# Patient Record
Sex: Female | Born: 1955 | Race: White | Hispanic: No | Marital: Married | Smoking: Current every day smoker
Health system: Southern US, Community
[De-identification: ages and names within clinical notes are randomized; demographics above are authoritative.]

## PROBLEM LIST (undated history)

## (undated) DIAGNOSIS — J40 Bronchitis, not specified as acute or chronic: Secondary | ICD-10-CM

## (undated) DIAGNOSIS — I10 Essential (primary) hypertension: Secondary | ICD-10-CM

## (undated) HISTORY — PX: BREAST ENHANCEMENT SURGERY: SHX7

## (undated) HISTORY — DX: Essential (primary) hypertension: I10

## (undated) HISTORY — DX: Bronchitis, not specified as acute or chronic: J40

---

## 2019-03-15 DIAGNOSIS — I10 Essential (primary) hypertension: Secondary | ICD-10-CM | POA: Insufficient documentation

## 2019-03-28 LAB — COLOGUARD: Cologuard: NEGATIVE

## 2019-04-13 LAB — HM MAMMOGRAPHY

## 2019-04-13 LAB — HM DEXA SCAN

## 2019-04-14 DIAGNOSIS — M8580 Other specified disorders of bone density and structure, unspecified site: Secondary | ICD-10-CM | POA: Insufficient documentation

## 2020-11-16 DIAGNOSIS — R053 Chronic cough: Secondary | ICD-10-CM | POA: Diagnosis not present

## 2020-11-16 DIAGNOSIS — J40 Bronchitis, not specified as acute or chronic: Secondary | ICD-10-CM | POA: Diagnosis not present

## 2020-12-04 ENCOUNTER — Other Ambulatory Visit: Payer: Self-pay

## 2020-12-04 ENCOUNTER — Ambulatory Visit (INDEPENDENT_AMBULATORY_CARE_PROVIDER_SITE_OTHER): Payer: Medicare HMO | Admitting: Family Medicine

## 2020-12-04 ENCOUNTER — Encounter: Payer: Self-pay | Admitting: Family Medicine

## 2020-12-04 VITALS — BP 138/80 | HR 94 | Temp 95.7°F | Ht 61.0 in | Wt 108.8 lb

## 2020-12-04 DIAGNOSIS — I10 Essential (primary) hypertension: Secondary | ICD-10-CM | POA: Diagnosis not present

## 2020-12-04 DIAGNOSIS — Z1231 Encounter for screening mammogram for malignant neoplasm of breast: Secondary | ICD-10-CM | POA: Diagnosis not present

## 2020-12-04 DIAGNOSIS — L0211 Cutaneous abscess of neck: Secondary | ICD-10-CM

## 2020-12-04 DIAGNOSIS — Z13820 Encounter for screening for osteoporosis: Secondary | ICD-10-CM | POA: Diagnosis not present

## 2020-12-04 DIAGNOSIS — Z780 Asymptomatic menopausal state: Secondary | ICD-10-CM

## 2020-12-04 DIAGNOSIS — B351 Tinea unguium: Secondary | ICD-10-CM | POA: Diagnosis not present

## 2020-12-04 DIAGNOSIS — J208 Acute bronchitis due to other specified organisms: Secondary | ICD-10-CM

## 2020-12-04 DIAGNOSIS — L03221 Cellulitis of neck: Secondary | ICD-10-CM | POA: Diagnosis not present

## 2020-12-04 DIAGNOSIS — Z6820 Body mass index (BMI) 20.0-20.9, adult: Secondary | ICD-10-CM | POA: Diagnosis not present

## 2020-12-04 MED ORDER — TERBINAFINE HCL 250 MG PO TABS: 250.0000 mg | ORAL_TABLET | Freq: Every day | ORAL | 0 refills | Status: DC

## 2020-12-04 MED ORDER — DOXYCYCLINE HYCLATE 100 MG PO TABS: 100.0000 mg | ORAL_TABLET | Freq: Two times a day (BID) | ORAL | 0 refills | Status: DC

## 2020-12-04 NOTE — Progress Notes (Signed)
New Patient Office Visit  Subjective:  Patient ID: Gabriella Gonzalez, female    DOB: 30-Nov-1955  Age: 65 y.o. MRN: [id number]  CC:  Chief Complaint  Patient presents with  . Establish Care    HPI Gabriella Gonzalez presents establish care c/o cough (was seen a [facility] on 5/13 and was rx'd prednisone, tessalon pearls, amoxicillin.)  Amoxicillin was given by dentist due to a infected root. Also has a knot on the back of her neck, which she noticed it last week. It does not itch or is painful. Husband stuck a needle in it yesterday and nothing came out.  Pt has a history of hypertension. Has taken amlodipine and lisinopril in the past, however, both medications caused pedal edema. Was on medicines last year. Lisinopril caused a cough. Also mentioned that at times she has urinary incontinence "Only if I don't make it to the bathroom in time".  Patient would like to have a medication for toenail fungus. Pt has quit smoking in the past when she was pregnant x 2.   Past Medical History:  Diagnosis Date  . Benign hypertension   . Bronchitis      Past Surgical History:  Procedure Laterality Date  . BREAST ENHANCEMENT SURGERY      Family History  Problem Relation Age of Onset  . COPD Mother   . Cancer Father        bladder    Social History   Socioeconomic History  . Marital status: Married    Spouse name: Tierrah Anastos  . Number of children: 2  . Years of education: Not on file  . Highest education level: Not on file  Occupational History  . Occupation: Retired  Tobacco Use  . Smoking status: Current Every Day Smoker    Packs/day: 0.25    Types: Cigarettes  . Smokeless tobacco: Never Used  Substance and Sexual Activity  . Alcohol use: Yes    Alcohol/week: 14.0 standard drinks    Types: 14 Standard drinks or equivalent per week    Comment: 1-2 per day  . Drug use: Never  . Sexual activity: Not on file  Other Topics Concern  . Not on file  Social History Narrative  . Not  on file   Social Determinants of Health   Financial Resource Strain: Not on file  Food Insecurity: Not on file  Transportation Needs: Not on file  Physical Activity: Not on file  Stress: Not on file  Social Connections: Not on file  Intimate Partner Violence: Not on file    ROS Review of Systems  Constitutional: Negative for chills, fatigue and fever.  HENT: Positive for hearing loss (Right ear. Feels like there is water in her ear.). Negative for congestion, ear pain, rhinorrhea and sore throat.   Respiratory: Positive for cough. Negative for shortness of breath.   Cardiovascular: Negative for chest pain.  Gastrointestinal: Negative for abdominal pain, constipation, diarrhea, nausea and vomiting.  Genitourinary: Negative for dysuria and urgency.       Has some urinary incontinence  Musculoskeletal: Negative for arthralgias, back pain and myalgias.  Skin:       Raised and hard bump on posterior neck-  Neurological: Negative for dizziness, weakness, light-headedness and headaches.  Psychiatric/Behavioral: Negative for dysphoric mood. The patient is not nervous/anxious.     Objective:   Today's Vitals: BP 138/80   Pulse 94   Temp (!) 95.7 F (35.4 C)   Ht 5\' 1"  (1.549 m)  Wt 108 lb 12.8 oz (49.4 kg)   SpO2 98%   BMI 20.56 kg/m   Physical Exam Vitals reviewed.  Constitutional:      Appearance: Normal appearance. She is normal weight.  HENT:     Right Ear: Tympanic membrane, ear canal and external ear normal.     Left Ear: Tympanic membrane, ear canal and external ear normal.     Nose: Nose normal.     Mouth/Throat:     Pharynx: Oropharynx is clear.  Neck:     Vascular: No carotid bruit.  Cardiovascular:     Rate and Rhythm: Normal rate and regular rhythm.     Pulses: Normal pulses.     Heart sounds: Normal heart sounds. No murmur heard.   Pulmonary:     Effort: Pulmonary effort is normal. No respiratory distress.     Breath sounds: Normal breath sounds.   Abdominal:     General: Abdomen is flat. Bowel sounds are normal.     Palpations: Abdomen is soft.     Tenderness: There is no abdominal tenderness.  Skin:    Findings: Lesion (posterior rt neck -1 to 1 1/2 cm nodule. Nontender. Increased erythema. ) present.     Comments: All toes BL onychomycosis.   Neurological:     Mental Status: She is alert and oriented to person, place, and time.  Psychiatric:        Mood and Affect: Mood normal.        Behavior: Behavior normal.     Assessment & Plan:  1. Primary hypertension No medications recommended at this time.  Recommend continue to work on eating healthy diet and exercise. - CBC with Differential/Platelet - TSH  2. Acute bronchitis due to other specified organisms Start on doxycycline. Stop amoxicillin.  Stop amoxicillin.  3. Cellulitis and abscess of neck - doxycycline (VIBRA-TABS) 100 MG tablet; Take 1 tablet (100 mg total) by mouth 2 (two) times daily.  Dispense: 14 tablet; Refill: 0  4. Onychomycosis - Comprehensive metabolic panel - terbinafine (LAMISIL) 250 MG tablet; Take 1 tablet (250 mg total) by mouth daily.  Dispense: 90 tablet; Refill: 0  5. Screening mammogram for breast cancer - MM DIGITAL SCREENING BILATERAL  6. Body mass index (BMI) 20.0-20.9, adult  7. Encounter for osteoporosis screening in asymptomatic postmenopausal patient - DG Bone Density     Outpatient Encounter Medications as of 12/04/2020  Medication Sig  . amoxicillin (AMOXIL) 500 MG capsule Take 500 mg by mouth every 6 (six) hours.  . benzonatate (TESSALON) 200 MG capsule Take 200 mg by mouth 3 (three) times daily as needed for cough.  . doxycycline (VIBRA-TABS) 100 MG tablet Take 1 tablet (100 mg total) by mouth 2 (two) times daily.  Marland Kitchen terbinafine (LAMISIL) 250 MG tablet Take 1 tablet (250 mg total) by mouth daily.  . [DISCONTINUED] benzonatate (TESSALON) 200 MG capsule Take 200 mg by mouth in the morning, at noon, and at bedtime.  .  [DISCONTINUED] amLODipine (NORVASC) 5 MG tablet Take 5 mg by mouth daily. (Patient not taking: Reported on 12/04/2020)  . [DISCONTINUED] lisinopril (ZESTRIL) 20 MG tablet Take 20 mg by mouth daily. (Patient not taking: Reported on 12/04/2020)   No facility-administered encounter medications on file as of 12/04/2020.    Follow-up: Return in about 6 weeks (around 01/15/2021) for fasting visit with Dr. Sedalia Muta or Carollee Herter.Blane Ohara, MD

## 2020-12-04 NOTE — Patient Instructions (Addendum)
Start on doxycycline antibiotic for nodule on posterior neck. If does not improve, pt needs to call and will refer to dermatology or a general surgeon for excision. Start on terbinafine for toe nail fungal infection.  Consider quitting smoking. Info below.  Schedule mammogram and bone density.  Set up patient with follow up with Creola Corn, LPN for annual wellness visit. Recommend pt come fasting so cholesterol can be checked.  Pulmonary testing is concerning for COPD (Chronic bronchitis.) Start on breztri 2 puffs twice a day. Call back if seems to be helping and will send a new prescription for this.    Chronic Obstructive Pulmonary Disease  Chronic obstructive pulmonary disease (COPD) is a long-term (chronic) lung problem. When you have COPD, it is hard for air to get in and out of your lungs. Usually the condition gets worse over time, and your lungs will never return to normal. There are things you can do to keep yourself as healthy as possible. What are the causes?  Smoking. This is the most common cause.  Certain genes passed from parent to child (inherited). What increases the risk?  Being exposed to secondhand smoke from cigarettes, pipes, or cigars.  Being exposed to chemicals and other irritants, such as fumes and dust in the work environment.  Having chronic lung conditions or infections. What are the signs or symptoms?  Shortness of breath, especially during physical activity.  A long-term cough with a large amount of thick mucus. Sometimes, the cough may not have any mucus (dry cough).  Wheezing.  Breathing quickly.  Skin that looks gray or blue, especially in the fingers, toes, or lips.  Feeling tired (fatigue).  Weight loss.  Chest tightness.  Having infections often.  Episodes when breathing symptoms become much worse (exacerbations). At the later stages of this disease, you may have swelling in the ankles, feet, or legs. How is this treated?  Taking  medicines.  Quitting smoking, if you smoke.  Rehabilitation. This includes steps to make your body work better. It may involve a team of specialists.  Doing exercises.  Making changes to your diet.  Using oxygen.  Lung surgery.  Lung transplant.  Comfort measures (palliative care). Follow these instructions at home: Medicines  Take over-the-counter and prescription medicines only as told by your doctor.  Talk to your doctor before taking any cough or allergy medicines. You may need to avoid medicines that cause your lungs to be dry. Lifestyle  If you smoke, stop smoking. Smoking makes the problem worse.  Do not smoke or use any products that contain nicotine or tobacco. If you need help quitting, ask your doctor.  Avoid being around things that make your breathing worse. This may include smoke, chemicals, and fumes.  Stay active, but remember to rest as well.  Learn and use tips on how to manage stress and control your breathing.  Make sure you get enough sleep. Most adults need at least 7 hours of sleep every night.  Eat healthy foods. Eat smaller meals more often. Rest before meals. Controlled breathing Learn and use tips on how to control your breathing as told by your doctor. Try:  Breathing in (inhaling) through your nose for 1 second. Then, pucker your lips and breath out (exhale) through your lips for 2 seconds.  Putting one hand on your belly (abdomen). Breathe in slowly through your nose for 1 second. Your hand on your belly should move out. Pucker your lips and breathe out slowly through your lips.  Your hand on your belly should move in as you breathe out.   Controlled coughing Learn and use controlled coughing to clear mucus from your lungs. Follow these steps: 1. Lean your head a little forward. 2. Breathe in deeply. 3. Try to hold your breath for 3 seconds. 4. Keep your mouth slightly open while coughing 2 times. 5. Spit any mucus out into a  tissue. 6. Rest and do the steps again 1 or 2 times as needed. General instructions  Make sure you get all the shots (vaccines) that your doctor recommends. Ask your doctor about a flu shot and a pneumonia shot.  Use oxygen therapy and pulmonary rehabilitation if told by your doctor. If you need home oxygen therapy, ask your doctor if you should buy a tool to measure your oxygen level (oximeter).  Make a COPD action plan with your doctor. This helps you to know what to do if you feel worse than usual.  Manage any other conditions you have as told by your doctor.  Avoid going outside when it is very hot, cold, or humid.  Avoid people who have a sickness you can catch (contagious).  Keep all follow-up visits. Contact a doctor if:  You cough up more mucus than usual.  There is a change in the color or thickness of the mucus.  It is harder to breathe than usual.  Your breathing is faster than usual.  You have trouble sleeping.  You need to use your medicines more often than usual.  You have trouble doing your normal activities such as getting dressed or walking around the house. Get help right away if:  You have shortness of breath while resting.  You have shortness of breath that stops you from: ? Being able to talk. ? Doing normal activities.  Your chest hurts for longer than 5 minutes.  Your skin color is more blue than usual.  Your pulse oximeter shows that you have low oxygen for longer than 5 minutes.  You have a fever.  You feel too tired to breathe normally. These symptoms may represent a serious problem that is an emergency. Do not wait to see if the symptoms will go away. Get medical help right away. Call your local emergency services (911 in the [place] Do not drive yourself to the hospital. Summary  Chronic obstructive pulmonary disease (COPD) is a long-term lung problem.  The way your lungs work will never return to normal. Usually the condition gets  worse over time. There are things you can do to keep yourself as healthy as possible.  Take over-the-counter and prescription medicines only as told by your doctor.  If you smoke, stop. Smoking makes the problem worse. This information is not intended to replace advice given to you by your health care provider. Make sure you discuss any questions you have with your health care provider. Document Revised: 05/01/2020 Document Reviewed: 05/01/2020 Elsevier Patient Education  2021 Elsevier Inc.   Tobacco Use Disorder Tobacco use disorder (TUD) occurs when a person craves, seeks, and uses tobacco, regardless of the consequences. This disorder can cause problems with mental and physical health. It can affect your ability to have healthy relationships, and it can keep you from meeting your responsibilities at work, home, or school. Tobacco may be:  Smoked as a cigarette or cigar.  Inhaled using e-cigarettes.  Smoked in a pipe or hookah.  Chewed as smokeless tobacco.  Inhaled into the nostrils as snuff. Tobacco products contain a dangerous chemical  called nicotine, which is very addictive. Nicotine triggers hormones that make the body feel stimulated and works on areas of the brain that make you feel good. These effects can make it hard for people to quit nicotine. Tobacco contains many other unsafe chemicals that can damage almost every organ in the body. Smoking tobacco also puts others in danger due to fire risk and possible health problems caused by breathing in secondhand smoke. What are the signs or symptoms? Symptoms of TUD may include:  Being unable to slow down or stop your tobacco use.  Spending an abnormal amount of time getting or using tobacco.  Craving tobacco. Cravings may last for up to 6 months after quitting.  Tobacco use that: ? Interferes with your work, school, or home life. ? Interferes with your personal and social relationships. ? Makes you give up activities  that you once enjoyed or found important.  Using tobacco even though you know that it is: ? Dangerous or bad for your health or someone else's health. ? Causing problems in your life.  Needing more and more of the substance to get the same effect (developing tolerance).  Experiencing unpleasant symptoms if you do not use the substance (withdrawal). Withdrawal symptoms may include: ? Depressed, anxious, or irritable mood. ? Difficulty concentrating. ? Increased appetite. ? Restlessness or trouble sleeping.  Using the substance to avoid withdrawal. How is this diagnosed? This condition may be diagnosed based on:  Your current and past tobacco use. Your health care provider may ask questions about how your tobacco use affects your life.  A physical exam. You may be diagnosed with TUD if you have at least two symptoms within a 59-month period. How is this treated? This condition is treated by stopping tobacco use. Many people are unable to quit on their own and need help. Treatment may include:  Nicotine replacement therapy (NRT). NRT provides nicotine without the other harmful chemicals in tobacco. NRT gradually lowers the dosage of nicotine in the body and reduces withdrawal symptoms. NRT is available as: ? Over-the-counter gums, lozenges, and skin patches. ? Prescription mouth inhalers and nasal sprays.  Medicine that acts on the brain to reduce cravings and withdrawal symptoms.  A type of talk therapy that examines your triggers for tobacco use, how to avoid them, and how to cope with cravings (behavioral therapy).  Hypnosis. This may help with withdrawal symptoms.  Joining a support group for others coping with TUD. The best treatment for TUD is usually a combination of medicine, talk therapy, and support groups. Recovery can be a long process. Many people start using tobacco again after stopping (relapse). If you relapse, it does not mean that treatment will not work. Follow  these instructions at home: Lifestyle  Do not use any products that contain nicotine or tobacco, such as cigarettes and e-cigarettes.  Avoid things that trigger tobacco use as much as you can. Triggers include people and situations that usually cause you to use tobacco.  Avoid drinks that contain caffeine, including coffee. These may worsen some withdrawal symptoms.  Find ways to manage stress. Wanting to smoke may cause stress, and stress can make you want to smoke. Relaxation techniques such as deep breathing, meditation, and yoga may help.  Attend support groups as needed. These groups are an important part of long-term recovery for many people. General instructions  Take over-the-counter and prescription medicines only as told by your health care provider.  Check with your health care provider before taking any  new prescription or over-the-counter medicines.  Decide on a friend, family member, or smoking quit-line (such as 1-800-QUIT-NOW in the [place] that you can call or text when you feel the urge to smoke or when you need help coping with cravings.  Keep all follow-up visits as told by your health care provider and therapist. This is important.   Contact a health care provider if:  You are not able to take your medicines as prescribed.  Your symptoms get worse, even with treatment. Summary  Tobacco use disorder (TUD) occurs when a person craves, seeks, and uses tobacco regardless of the consequences.  This condition may be diagnosed based on your current and past tobacco use and a physical exam.  Many people are unable to quit on their own and need help. Recovery can be a long process.  The most effective treatment for TUD is usually a combination of medicine, talk therapy, and support groups. This information is not intended to replace advice given to you by your health care provider. Make sure you discuss any questions you have with your health care provider. Document  Revised: 06/10/2017 Document Reviewed: 06/10/2017 Elsevier Patient Education  2021 [employer]

## 2020-12-05 LAB — CBC WITH DIFFERENTIAL/PLATELET
Basophils Absolute: 0 10*3/uL (ref 0.0–0.2)
Basos: 0 %
EOS (ABSOLUTE): 0.1 10*3/uL (ref 0.0–0.4)
Eos: 1 %
Hematocrit: 43.4 % (ref 34.0–46.6)
Hemoglobin: 14.2 g/dL (ref 11.1–15.9)
Immature Grans (Abs): 0 10*3/uL (ref 0.0–0.1)
Immature Granulocytes: 0 %
Lymphocytes Absolute: 2.3 10*3/uL (ref 0.7–3.1)
Lymphs: 26 %
MCH: 32.1 pg (ref 26.6–33.0)
MCHC: 32.7 g/dL (ref 31.5–35.7)
MCV: 98 fL — ABNORMAL HIGH (ref 79–97)
Monocytes Absolute: 0.6 10*3/uL (ref 0.1–0.9)
Monocytes: 6 %
Neutrophils Absolute: 5.7 10*3/uL (ref 1.4–7.0)
Neutrophils: 67 %
Platelets: 283 10*3/uL (ref 150–450)
RBC: 4.43 x10E6/uL (ref 3.77–5.28)
RDW: 12.5 % (ref 11.7–15.4)
WBC: 8.6 10*3/uL (ref 3.4–10.8)

## 2020-12-05 LAB — COMPREHENSIVE METABOLIC PANEL
ALT: 22 IU/L (ref 0–32)
AST: 37 IU/L (ref 0–40)
Albumin/Globulin Ratio: 1.6 (ref 1.2–2.2)
Albumin: 4.4 g/dL (ref 3.8–4.8)
Alkaline Phosphatase: 97 IU/L (ref 44–121)
BUN/Creatinine Ratio: 15 (ref 12–28)
BUN: 12 mg/dL (ref 8–27)
Bilirubin Total: 0.9 mg/dL (ref 0.0–1.2)
CO2: 23 mmol/L (ref 20–29)
Calcium: 9.2 mg/dL (ref 8.7–10.3)
Chloride: 104 mmol/L (ref 96–106)
Creatinine, Ser: 0.78 mg/dL (ref 0.57–1.00)
Globulin, Total: 2.7 g/dL (ref 1.5–4.5)
Glucose: 93 mg/dL (ref 65–99)
Potassium: 3.9 mmol/L (ref 3.5–5.2)
Sodium: 140 mmol/L (ref 134–144)
Total Protein: 7.1 g/dL (ref 6.0–8.5)
eGFR: 84 mL/min/{1.73_m2} (ref >59)

## 2020-12-05 LAB — TSH: TSH: 1.48 u[IU]/mL (ref 0.450–4.500)

## 2020-12-11 ENCOUNTER — Telehealth: Payer: Self-pay | Admitting: Family Medicine

## 2020-12-11 ENCOUNTER — Encounter: Payer: Self-pay | Admitting: Family Medicine

## 2020-12-11 DIAGNOSIS — B351 Tinea unguium: Secondary | ICD-10-CM | POA: Insufficient documentation

## 2020-12-11 DIAGNOSIS — I10 Essential (primary) hypertension: Secondary | ICD-10-CM | POA: Insufficient documentation

## 2020-12-11 NOTE — Telephone Encounter (Signed)
   Gabriella Gonzalez has been scheduled for the following appointment:  WHAT: [facility] WHERE: [facility] DATE: 04/15/21 TIME: 12:30 pm Arrival time  Patient has been made aware.

## 2020-12-12 ENCOUNTER — Encounter: Payer: Self-pay | Admitting: Family Medicine

## 2021-01-16 ENCOUNTER — Ambulatory Visit (INDEPENDENT_AMBULATORY_CARE_PROVIDER_SITE_OTHER): Payer: Medicare HMO | Admitting: Family Medicine

## 2021-01-16 ENCOUNTER — Encounter: Payer: Self-pay | Admitting: Family Medicine

## 2021-01-16 ENCOUNTER — Other Ambulatory Visit: Payer: Self-pay

## 2021-01-16 VITALS — BP 124/68 | HR 76 | Temp 97.1°F | Resp 16 | Ht 61.0 in | Wt 112.0 lb

## 2021-01-16 DIAGNOSIS — Z23 Encounter for immunization: Secondary | ICD-10-CM

## 2021-01-16 DIAGNOSIS — I10 Essential (primary) hypertension: Secondary | ICD-10-CM

## 2021-01-16 DIAGNOSIS — F17219 Nicotine dependence, cigarettes, with unspecified nicotine-induced disorders: Secondary | ICD-10-CM

## 2021-01-16 DIAGNOSIS — B351 Tinea unguium: Secondary | ICD-10-CM

## 2021-01-16 NOTE — Patient Instructions (Signed)

## 2021-01-16 NOTE — Progress Notes (Signed)
Subjective:  Patient ID: Gabriella Gonzalez, female    DOB: [birth year]  Age: 65 y.o. MRN: [id number]  Chief Complaint  Patient presents with   Hyperlipidemia    HPI Hypertension: Not currently on any medicines. Eats healthy and exercise. Here for follow up for lipids.   Onychomycosis: on lamisil. Nails have improved.   Current Outpatient Medications on File Prior to Visit  Medication Sig Dispense Refill   terbinafine (LAMISIL) 250 MG tablet Take 1 tablet (250 mg total) by mouth daily. 90 tablet 0   No current facility-administered medications on file prior to visit.   Past Medical History:  Diagnosis Date   Benign hypertension    Bronchitis    Past Surgical History:  Procedure Laterality Date   BREAST ENHANCEMENT SURGERY      Family History  Problem Relation Age of Onset   COPD Mother    Cancer Father        bladder   Social History   Socioeconomic History   Marital status: Married    Spouse name: Elliott Quade   Number of children: 2   Years of education: Not on file   Highest education level: Not on file  Occupational History   Occupation: Retired  Tobacco Use   Smoking status: Every Day    Packs/day: 0.50    Types: Cigarettes   Smokeless tobacco: Never  Substance and Sexual Activity   Alcohol use: Yes    Alcohol/week: 14.0 standard drinks    Types: 14 Standard drinks or equivalent per week    Comment: 1-2 per day   Drug use: Never   Sexual activity: Not on file  Other Topics Concern   Not on file  Social History Narrative   Not on file   Social Determinants of Health   Financial Resource Strain: Not on file  Food Insecurity: Not on file  Transportation Needs: Not on file  Physical Activity: Not on file  Stress: Not on file  Social Connections: Not on file    Review of Systems  Constitutional:  Negative for chills, fatigue and fever.  HENT:  Negative for congestion, rhinorrhea and sore throat.   Respiratory:  Positive for cough and shortness of  breath.   Cardiovascular:  Negative for chest pain.  Gastrointestinal:  Negative for abdominal pain, constipation, diarrhea, nausea and vomiting.  Genitourinary:  Negative for dysuria and urgency.  Musculoskeletal:  Negative for back pain and myalgias.  Neurological:  Negative for dizziness, weakness, light-headedness and headaches.  Psychiatric/Behavioral:  Negative for dysphoric mood. The patient is not nervous/anxious.   Cough and Sob since bronchitis 2 months ago. Improved, but not resolved.   Objective:  BP 124/68   Pulse 76   Temp (!) 97.1 F (36.2 C)   Resp 16   Ht 5\' 1"  (1.549 m)   Wt 112 lb (50.8 kg)   BMI 21.16 kg/m   BP/Weight 01/16/2021 12/04/2020  Systolic BP 124 138  Diastolic BP 68 80  Wt. (Lbs) 112 108.8  BMI 21.16 20.56    Physical Exam Vitals reviewed.  Constitutional:      Appearance: Normal appearance. She is normal weight.  Neck:     Vascular: No carotid bruit.  Cardiovascular:     Rate and Rhythm: Normal rate and regular rhythm.     Heart sounds: Normal heart sounds.  Pulmonary:     Effort: Pulmonary effort is normal. No respiratory distress.     Breath sounds: Normal breath sounds.  Abdominal:  General: Abdomen is flat. Bowel sounds are normal.     Palpations: Abdomen is soft.     Tenderness: There is no abdominal tenderness.  Skin:    Comments: Onychomycosis improving on all toes.   Neurological:     Mental Status: She is alert and oriented to person, place, and time.  Psychiatric:        Mood and Affect: Mood normal.        Behavior: Behavior normal.    Diabetic Foot Exam - Simple   No data filed      Lab Results  Component Value Date   WBC 8.6 12/04/2020   HGB 14.2 12/04/2020   HCT 43.4 12/04/2020   PLT 283 12/04/2020   GLUCOSE 93 12/04/2020   ALT 22 12/04/2020   AST 37 12/04/2020   NA 140 12/04/2020   K 3.9 12/04/2020   CL 104 12/04/2020   CREATININE 0.78 12/04/2020   BUN 12 12/04/2020   CO2 23 12/04/2020   TSH 1.480  12/04/2020      Assessment & Plan:   1. Primary hypertension Well controlled.  No changes to medicines.  Continue to work on eating a healthy diet and exercise.  Labs drawn today.  - Comprehensive metabolic panel - Lipid panel  2. Onychomycosis Continue terbinafine.  - CMP.   3. Need for 23-polyvalent pneumococcal polysaccharide vaccine - Pneumococcal polysaccharide vaccine 23-valent greater than or equal to 2yo subcutaneous/IM  4. Cigarette nicotine dependence with nicotine-induced disorder  Recommended cessation.   Orders Placed This Encounter  Procedures   Pneumococcal polysaccharide vaccine 23-valent greater than or equal to 2yo subcutaneous/IM   Comprehensive metabolic panel   Lipid panel     Follow-up: Return in about 3 months (around 04/18/2021) for AWV WITH Kim.  An After Visit Summary was printed and given to the patient.  Blane Ohara, MD [facility] [phone]

## 2021-01-17 LAB — COMPREHENSIVE METABOLIC PANEL
ALT: 13 IU/L (ref 0–32)
AST: 16 IU/L (ref 0–40)
Albumin/Globulin Ratio: 1.9 (ref 1.2–2.2)
Albumin: 4.4 g/dL (ref 3.8–4.8)
Alkaline Phosphatase: 91 IU/L (ref 44–121)
BUN/Creatinine Ratio: 15 (ref 12–28)
BUN: 10 mg/dL (ref 8–27)
Bilirubin Total: 0.6 mg/dL (ref 0.0–1.2)
CO2: 24 mmol/L (ref 20–29)
Calcium: 9.5 mg/dL (ref 8.7–10.3)
Chloride: 104 mmol/L (ref 96–106)
Creatinine, Ser: 0.66 mg/dL (ref 0.57–1.00)
Globulin, Total: 2.3 g/dL (ref 1.5–4.5)
Glucose: 90 mg/dL (ref 65–99)
Potassium: 4.5 mmol/L (ref 3.5–5.2)
Sodium: 141 mmol/L (ref 134–144)
Total Protein: 6.7 g/dL (ref 6.0–8.5)
eGFR: 96 mL/min/{1.73_m2} (ref >59)

## 2021-01-17 LAB — LIPID PANEL
Chol/HDL Ratio: 2.4 ratio (ref 0.0–4.4)
Cholesterol, Total: 167 mg/dL (ref 100–199)
HDL: 70 mg/dL (ref >39)
LDL Chol Calc (NIH): 75 mg/dL (ref 0–99)
Triglycerides: 127 mg/dL (ref 0–149)
VLDL Cholesterol Cal: 22 mg/dL (ref 5–40)

## 2021-01-17 LAB — CARDIOVASCULAR RISK ASSESSMENT

## 2021-01-28 ENCOUNTER — Ambulatory Visit
Admission: RE | Admit: 2021-01-28 | Discharge: 2021-01-28 | Disposition: A | Discharge status: Home / Self Care | Payer: Medicare HMO | Source: Ambulatory Visit | Attending: Family Medicine | Admitting: Family Medicine

## 2021-01-28 ENCOUNTER — Other Ambulatory Visit: Payer: Self-pay

## 2021-01-28 DIAGNOSIS — Z1231 Encounter for screening mammogram for malignant neoplasm of breast: Secondary | ICD-10-CM | POA: Diagnosis not present

## 2021-04-22 ENCOUNTER — Encounter: Payer: Self-pay | Admitting: Family Medicine

## 2021-04-22 ENCOUNTER — Other Ambulatory Visit: Payer: Self-pay

## 2021-04-22 ENCOUNTER — Ambulatory Visit: Payer: Medicare HMO

## 2021-04-22 ENCOUNTER — Ambulatory Visit (INDEPENDENT_AMBULATORY_CARE_PROVIDER_SITE_OTHER): Payer: Medicare HMO | Admitting: Family Medicine

## 2021-04-22 VITALS — BP 128/78 | HR 92 | Temp 96.8°F | Ht 61.0 in | Wt 113.0 lb

## 2021-04-22 DIAGNOSIS — S39012A Strain of muscle, fascia and tendon of lower back, initial encounter: Secondary | ICD-10-CM

## 2021-04-22 DIAGNOSIS — Z23 Encounter for immunization: Secondary | ICD-10-CM | POA: Diagnosis not present

## 2021-04-22 MED ORDER — MELOXICAM 15 MG PO TABS: 15.0000 mg | ORAL_TABLET | Freq: Every day | ORAL | 0 refills | Status: DC

## 2021-04-22 NOTE — Patient Instructions (Signed)
Take meloxicam 15 mg once daily.  No aleve or ibuprofen with meloxicam.  May take tylenol (acetamiophen) as directed on the bottle.  

## 2021-04-22 NOTE — Progress Notes (Signed)
Acute Office Visit  Subjective:    Patient ID: Gabriella Gonzalez, female    DOB: 1955-12-13, 65 y.o.   MRN: [id number]  Chief Complaint  Patient presents with   [place] [address] and hit lower back    Happened two weeks ago    HPI Patient is in today for a fall and back pain. Pt was carrying a lot and somehow lost her balance or got caught in something. Pt fell on Tail bone. Occurred on 04/04/2021. No ice or heat. Some aleve and/or ibuprofen. Hot shower sometimes helps.  Aleve otc x 1 and Ibuprofen 200 mg 2 twice a day. NO GI upset.   Past Medical History:  Diagnosis Date   Benign hypertension    Bronchitis     Past Surgical History:  Procedure Laterality Date   BREAST ENHANCEMENT SURGERY      Family History  Problem Relation Age of Onset   COPD Mother    Cancer Father        bladder    Social History   Socioeconomic History   Marital status: Married    Spouse name: Marka Treloar   Number of children: 2   Years of education: Not on file   Highest education level: Not on file  Occupational History   Occupation: Retired  Tobacco Use   Smoking status: Every Day    Packs/day: 0.50    Types: Cigarettes   Smokeless tobacco: Never  Substance and Sexual Activity   Alcohol use: Yes    Alcohol/week: 14.0 standard drinks    Types: 14 Standard drinks or equivalent per week    Comment: 1-2 per day   Drug use: Never   Sexual activity: Not on file  Other Topics Concern   Not on file  Social History Narrative   Not on file   Social Determinants of Health   Financial Resource Strain: Not on file  Food Insecurity: Not on file  Transportation Needs: Not on file  Physical Activity: Not on file  Stress: Not on file  Social Connections: Not on file  Intimate Partner Violence: Not on file    Outpatient Medications Prior to Visit  Medication Sig Dispense Refill   terbinafine (LAMISIL) 250 MG tablet Take 1 tablet (250 mg total) by mouth daily. 90 tablet 0   No facility-administered  medications prior to visit.    Allergies  Allergen Reactions   Amlodipine     Pedal edema    Lisinopril     Pedal edema    Review of Systems  Constitutional:  Negative for appetite change, fatigue and fever.  HENT:  Negative for congestion, ear pain, sinus pressure and sore throat.   Eyes:  Negative for pain.  Respiratory:  Negative for cough, chest tightness, shortness of breath and wheezing.   Cardiovascular:  Negative for chest pain and palpitations.  Gastrointestinal:  Negative for abdominal pain, constipation, diarrhea, nausea and vomiting.  Genitourinary:  Negative for dysuria and hematuria.  Musculoskeletal:  Positive for back pain (Right lower back/Buttox pain running down right leg). Negative for arthralgias, joint swelling and myalgias.  Skin:  Negative for rash.  Neurological:  Negative for dizziness, weakness and headaches.  Psychiatric/Behavioral:  Negative for dysphoric mood. The patient is not nervous/anxious.       Objective:    Physical Exam Vitals reviewed.  Constitutional:      Appearance: Normal appearance. She is normal weight.  Cardiovascular:     Rate and Rhythm: Normal rate and regular rhythm.  Heart sounds: Normal heart sounds.  Pulmonary:     Effort: Pulmonary effort is normal. No respiratory distress.     Breath sounds: Normal breath sounds.  Abdominal:     General: Abdomen is flat. Bowel sounds are normal.     Palpations: Abdomen is soft.     Tenderness: There is no abdominal tenderness.  Musculoskeletal:        General: Tenderness (rt lumbar tendeness into rt buttock. Negative SLR BL.) present.  Neurological:     Mental Status: She is alert and oriented to person, place, and time.  Psychiatric:        Behavior: Behavior normal.     Comments: Flat affect. Denies depression.    BP (!) 142/88 (BP Location: Right Arm, Patient Position: Sitting)   Pulse 92   Temp (!) 96.8 F (36 C) (Temporal)   Ht 5' 1"  (1.549 m)   Wt 113 lb (51.3 kg)    SpO2 98%   BMI 21.35 kg/m  Wt Readings from Last 3 Encounters:  04/22/21 113 lb (51.3 kg)  01/16/21 112 lb (50.8 kg)  12/04/20 108 lb 12.8 oz (49.4 kg)    Health Maintenance Due  Topic Date Due   Hepatitis C Screening  Never done   TETANUS/TDAP  Never done   Zoster Vaccines- Shingrix (1 of 2) Never done   INFLUENZA VACCINE  02/04/2021    There are no preventive care reminders to display for this patient.   Lab Results  Component Value Date   TSH 1.480 12/04/2020   Lab Results  Component Value Date   WBC 8.6 12/04/2020   HGB 14.2 12/04/2020   HCT 43.4 12/04/2020   MCV 98 (H) 12/04/2020   PLT 283 12/04/2020   Lab Results  Component Value Date   NA 141 01/16/2021   K 4.5 01/16/2021   CO2 24 01/16/2021   GLUCOSE 90 01/16/2021   BUN 10 01/16/2021   CREATININE 0.66 01/16/2021   BILITOT 0.6 01/16/2021   ALKPHOS 91 01/16/2021   AST 16 01/16/2021   ALT 13 01/16/2021   PROT 6.7 01/16/2021   ALBUMIN 4.4 01/16/2021   CALCIUM 9.5 01/16/2021   EGFR 96 01/16/2021   Lab Results  Component Value Date   CHOL 167 01/16/2021   Lab Results  Component Value Date   HDL 70 01/16/2021   Lab Results  Component Value Date   LDLCALC 75 01/16/2021   Lab Results  Component Value Date   TRIG 127 01/16/2021   Lab Results  Component Value Date   CHOLHDL 2.4 01/16/2021   No results found for: HGBA1C       Assessment & Plan:   Problem List Items Addressed This Visit       Musculoskeletal and Integument   Strain of lumbar region - Primary    Take meloxicam 15 mg once daily.  No aleve or ibuprofen with meloxicam.  May take tylenol (acetamiophen) as directed on the bottle.        I,Lauren M Auman,acting as a scribe for Rochel Brome, MD.,have documented all relevant documentation on the behalf of Rochel Brome, MD,as directed by  Rochel Brome, MD while in the presence of Rochel Brome, MD.   Oleta Mouse, CMA

## 2021-04-22 NOTE — Assessment & Plan Note (Signed)
Take meloxicam 15 mg once daily.  No aleve or ibuprofen with meloxicam.  May take tylenol (acetamiophen) as directed on the bottle.

## 2021-05-06 ENCOUNTER — Other Ambulatory Visit: Payer: Self-pay

## 2021-05-06 ENCOUNTER — Ambulatory Visit (INDEPENDENT_AMBULATORY_CARE_PROVIDER_SITE_OTHER): Payer: Medicare HMO

## 2021-05-06 VITALS — BP 118/76 | HR 95 | Resp 16 | Ht 61.0 in | Wt 115.6 lb

## 2021-05-06 DIAGNOSIS — Z0000 Encounter for general adult medical examination without abnormal findings: Secondary | ICD-10-CM | POA: Diagnosis not present

## 2021-05-06 DIAGNOSIS — F17219 Nicotine dependence, cigarettes, with unspecified nicotine-induced disorders: Secondary | ICD-10-CM | POA: Diagnosis not present

## 2021-05-06 DIAGNOSIS — Z6821 Body mass index (BMI) 21.0-21.9, adult: Secondary | ICD-10-CM

## 2021-05-06 NOTE — Patient Instructions (Signed)
Health Maintenance, Female Adopting a healthy lifestyle and getting preventive care are important in promoting health and wellness. Ask your health care provider about: The right schedule for you to have regular tests and exams. Things you can do on your own to prevent diseases and keep yourself healthy. What should I know about diet, weight, and exercise? Eat a healthy diet  Eat a diet that includes plenty of vegetables, fruits, low-fat dairy products, and lean protein. Do not eat a lot of foods that are high in solid fats, added sugars, or sodium. Maintain a healthy weight Body mass index (BMI) is used to identify weight problems. It estimates body fat based on height and weight. Your health care provider can help determine your BMI and help you achieve or maintain a healthy weight. Get regular exercise Get regular exercise. This is one of the most important things you can do for your health. Most adults should: Exercise for at least 150 minutes each week. The exercise should increase your heart rate and make you sweat (moderate-intensity exercise). Do strengthening exercises at least twice a week. This is in addition to the moderate-intensity exercise. Spend less time sitting. Even light physical activity can be beneficial. Watch cholesterol and blood lipids Have your blood tested for lipids and cholesterol at 67 years of age, then have this test every 5 years. Have your cholesterol levels checked more often if: Your lipid or cholesterol levels are high. You are older than 67 years of age. You are at high risk for heart disease. What should I know about cancer screening? Depending on your health history and family history, you may need to have cancer screening at various ages. This may include screening for: Breast cancer. Cervical cancer. Colorectal cancer. Skin cancer. Lung cancer. What should I know about heart disease, diabetes, and high blood pressure? Blood pressure and heart  disease High blood pressure causes heart disease and increases the risk of stroke. This is more likely to develop in people who have high blood pressure readings, are of African descent, or are overweight. Have your blood pressure checked: Every 3-5 years if you are 18-39 years of age. Every year if you are 40 years old or older. Diabetes Have regular diabetes screenings. This checks your fasting blood sugar level. Have the screening done: Once every three years after age 40 if you are at a normal weight and have a low risk for diabetes. More often and at a younger age if you are overweight or have a high risk for diabetes. What should I know about preventing infection? Hepatitis B If you have a higher risk for hepatitis B, you should be screened for this virus. Talk with your health care provider to find out if you are at risk for hepatitis B infection. Hepatitis C Testing is recommended for: Everyone born from 1945 through 1965. Anyone with known risk factors for hepatitis C. Sexually transmitted infections (STIs) Get screened for STIs, including gonorrhea and chlamydia, if: You are sexually active and are younger than 67 years of age. You are older than 67 years of age and your health care provider tells you that you are at risk for this type of infection. Your sexual activity has changed since you were last screened, and you are at increased risk for chlamydia or gonorrhea. Ask your health care provider if you are at risk. Ask your health care provider about whether you are at high risk for HIV. Your health care provider may recommend a prescription medicine   to help prevent HIV infection. If you choose to take medicine to prevent HIV, you should first get tested for HIV. You should then be tested every 3 months for as long as you are taking the medicine. Pregnancy If you are about to stop having your period (premenopausal) and you may become pregnant, seek counseling before you get  pregnant. Take 400 to 800 micrograms (mcg) of folic acid every day if you become pregnant. Ask for birth control (contraception) if you want to prevent pregnancy. Osteoporosis and menopause Osteoporosis is a disease in which the bones lose minerals and strength with aging. This can result in bone fractures. If you are 65 years old or older, or if you are at risk for osteoporosis and fractures, ask your health care provider if you should: Be screened for bone loss. Take a calcium or vitamin D supplement to lower your risk of fractures. Be given hormone replacement therapy (HRT) to treat symptoms of menopause. Follow these instructions at home: Lifestyle Do not use any products that contain nicotine or tobacco, such as cigarettes, e-cigarettes, and chewing tobacco. If you need help quitting, ask your health care provider. Do not use street drugs. Do not share needles. Ask your health care provider for help if you need support or information about quitting drugs. Alcohol use Do not drink alcohol if: Your health care provider tells you not to drink. You are pregnant, may be pregnant, or are planning to become pregnant. If you drink alcohol: Limit how much you use to 0-1 drink a day. Limit intake if you are breastfeeding. Be aware of how much alcohol is in your drink. In the [place] one drink equals one 12 oz bottle of beer (355 mL), one 5 oz glass of wine (148 mL), or one 1 oz glass of hard liquor (44 mL). General instructions Schedule regular health, dental, and eye exams. Stay current with your vaccines. Tell your health care provider if: You often feel depressed. You have ever been abused or do not feel safe at home. Summary Adopting a healthy lifestyle and getting preventive care are important in promoting health and wellness. Follow your health care provider's instructions about healthy diet, exercising, and getting tested or screened for diseases. Follow your health care provider's  instructions on monitoring your cholesterol and blood pressure. This information is not intended to replace advice given to you by your health care provider. Make sure you discuss any questions you have with your health care provider. Document Revised: 08/31/2020 Document Reviewed: 06/16/2018 Elsevier Patient Education  2022 [employer]  Steps to Quit Smoking Smoking tobacco is the leading cause of preventable death. It can affect almost every organ in the body. Smoking puts you and people around you at risk for many serious, long-lasting (chronic) diseases. Quitting smoking can be hard, but it is one of the best things that you can do for your health. It is never too late to quit. How do I get ready to quit? When you decide to quit smoking, make a plan to help you succeed. Before you quit: Pick a date to quit. Set a date within the next 2 weeks to give you time to prepare. Write down the reasons why you are quitting. Keep this list in places where you will see it often. Tell your family, friends, and co-workers that you are quitting. Their support is important. Talk with your doctor about the choices that may help you quit. Find out if your health insurance will pay for these   treatments. Know the people, places, things, and activities that make you want to smoke (triggers). Avoid them. What first steps can I take to quit smoking? Throw away all cigarettes at home, at work, and in your car. Throw away the things that you use when you smoke, such as ashtrays and lighters. Clean your car. Make sure to empty the ashtray. Clean your home, including curtains and carpets. What can I do to help me quit smoking? Talk with your doctor about taking medicines and seeing a counselor at the same time. You are more likely to succeed when you do both. If you are pregnant or breastfeeding, talk with your doctor about counseling or other ways to quit smoking. Do not take medicine to help you quit smoking  unless your doctor tells you to do so. To quit smoking: Quit right away Quit smoking totally, instead of slowly cutting back on how much you smoke over a period of time. Go to counseling. You are more likely to quit if you go to counseling sessions regularly. Take medicine You may take medicines to help you quit. Some medicines need a prescription, and some you can buy over-the-counter. Some medicines may contain a drug called nicotine to replace the nicotine in cigarettes. Medicines may: Help you to stop having the desire to smoke (cravings). Help to stop the problems that come when you stop smoking (withdrawal symptoms). Your doctor may ask you to use: Nicotine patches, gum, or lozenges. Nicotine inhalers or sprays. Non-nicotine medicine that is taken by mouth. Find resources Find resources and other ways to help you quit smoking and remain smoke-free after you quit. These resources are most helpful when you use them often. They include: Online chats with a counselor. Phone quitlines. Printed self-help materials. Support groups or group counseling. Text messaging programs. Mobile phone apps. Use apps on your mobile phone or tablet that can help you stick to your quit plan. There are many free apps for mobile phones and tablets as well as websites. Examples include Quit Guide from the CDC and smokefree.gov  What things can I do to make it easier to quit?  Talk to your family and friends. Ask them to support and encourage you. Call a phone quitline (1-800-QUIT-NOW), reach out to support groups, or work with a counselor. Ask people who smoke to not smoke around you. Avoid places that make you want to smoke, such as: Bars. Parties. Smoke-break areas at work. Spend time with people who do not smoke. Lower the stress in your life. Stress can make you want to smoke. Try these things to help your stress: Getting regular exercise. Doing deep-breathing exercises. Doing  yoga. Meditating. Doing a body scan. To do this, close your eyes, focus on one area of your body at a time from head to toe. Notice which parts of your body are tense. Try to relax the muscles in those areas. How will I feel when I quit smoking? Day 1 to 3 weeks Within the first 24 hours, you may start to have some problems that come from quitting tobacco. These problems are very bad 2-3 days after you quit, but they do not often last for more than 2-3 weeks. You may get these symptoms: Mood swings. Feeling restless, nervous, angry, or annoyed. Trouble concentrating. Dizziness. Strong desire for high-sugar foods and nicotine. Weight gain. Trouble pooping (constipation). Feeling like you may vomit (nausea). Coughing or a sore throat. Changes in how the medicines that you take for other issues work in   your body. Depression. Trouble sleeping (insomnia). Week 3 and afterward After the first 2-3 weeks of quitting, you may start to notice more positive results, such as: Better sense of smell and taste. Less coughing and sore throat. Slower heart rate. Lower blood pressure. Clearer skin. Better breathing. Fewer sick days. Quitting smoking can be hard. Do not give up if you fail the first time. Some people need to try a few times before they succeed. Do your best to stick to your quit plan, and talk with your doctor if you have any questions or concerns. Summary Smoking tobacco is the leading cause of preventable death. Quitting smoking can be hard, but it is one of the best things that you can do for your health. When you decide to quit smoking, make a plan to help you succeed. Quit smoking right away, not slowly over a period of time. When you start quitting, seek help from your doctor, family, or friends. This information is not intended to replace advice given to you by your health care provider. Make sure you discuss any questions you have with your health care provider. Document  Revised: 03/18/2019 Document Reviewed: 09/11/2018 Elsevier Patient Education  2022 [employer].  

## 2021-05-06 NOTE — Progress Notes (Signed)
Subjective:   Gabriella Gonzalez is a 65 y.o. female who presents for Medicare Annual (Subsequent) preventive examination.  This wellness visit is conducted by a nurse.  The patient's medications were reviewed and reconciled since the patient's last visit.  History details were provided by the patient.  The history appears to be reliable.    Medical History: Patient history and Family history was reviewed  Medications, Allergies, and preventative health maintenance was reviewed and updated.   Review of Systems    ROS - Negative Cardiac Risk Factors include: smoking/ tobacco exposure;advanced age (>64men, >72 women)     Objective:    Today's Vitals   05/06/21 1450  BP: 118/76  Pulse: 95  Resp: 16  SpO2: 96%  Weight: 115 lb 9.6 oz (52.4 kg)  Height: 5\' 1"  (1.549 m)  PainSc: 0-No pain   Body mass index is 21.84 kg/m.  Advanced Directives 05/06/2021  Does Patient Have a Medical Advance Directive? No  Would patient like information on creating a medical advance directive? Yes (MAU/Ambulatory/Procedural Areas - Information given)    Current Medications (verified) Outpatient Encounter Medications as of 05/06/2021  Medication Sig   meloxicam (MOBIC) 15 MG tablet Take 1 tablet (15 mg total) by mouth daily.   No facility-administered encounter medications on file as of 05/06/2021.    Allergies (verified) Amlodipine and Lisinopril   History: Past Medical History:  Diagnosis Date   Benign hypertension    Bronchitis    Past Surgical History:  Procedure Laterality Date   BREAST ENHANCEMENT SURGERY     Family History  Problem Relation Age of Onset   COPD Mother    Cancer Father        bladder   Social History   Socioeconomic History   Marital status: Married    Spouse name: Aracelie Addis   Number of children: 2  Occupational History   Occupation: Retired  Tobacco Use   Smoking status: Every Day    Packs/day: 0.50    Types: Cigarettes   Smokeless tobacco: Never   Substance and Sexual Activity   Alcohol use: Yes    Alcohol/week: 14.0 standard drinks    Types: 14 Standard drinks or equivalent per week    Comment: 1-2 per day   Drug use: Never   Sexual activity: Not on file   Social Determinants of Health   Financial Resource Strain: Not on file  Food Insecurity: No Food Insecurity   Worried About Darcel Smalling in the Last Year: Never true   Ran Out of Food in the Last Year: Never true  Transportation Needs: No Transportation Needs   Lack of Transportation (Medical): No   Lack of Transportation (Non-Medical): No  Physical Activity: Inactive   Days of Exercise per Week: 0 days   Minutes of Exercise per Session: 0 min  Stress: Not on file  Social Connections: Not on file    Tobacco Counseling Ready to quit: Yes Counseling given: discussed ways to quit smoking, printed information given to patient   Clinical Intake:  Pre-visit preparation completed: Yes  Pain : No/denies pain Pain Score: 0-No pain   BMI - recorded: 21.84 Nutritional Status: BMI of 19-24  Normal Nutritional Risks: None Diabetes: No How often do you need to have someone help you when you read instructions, pamphlets, or other written materials from your doctor or pharmacy?: 1 - Never Interpreter Needed?: No   Activities of Daily Living In your present state of health, do you  have any difficulty performing the following activities: 05/06/2021 12/04/2020  Hearing? N N  Vision? N N  Difficulty concentrating or making decisions? N N  Walking or climbing stairs? N N  Dressing or bathing? N N  Doing errands, shopping? N N  Preparing Food and eating ? N -  Using the Toilet? N -  In the past six months, have you accidently leaked urine? N -  Do you have problems with loss of bowel control? N -  Managing your Medications? N -  Managing your Finances? N -  Housekeeping or managing your Housekeeping? N -    Patient Care Team: Blane Ohara, MD as PCP - General  (Family Medicine)     Assessment:   This is a routine wellness examination for Gabriella Gonzalez.  Hearing/Vision screen No results found.  Dietary issues and exercise activities discussed: Current Exercise Habits: The patient does not participate in regular exercise at present, Exercise limited by: None identified   Goals Addressed             This Visit's Progress    Quit Smoking       Currently smokes 0.5 pack per day -Decrease number of cigarettes smoked each day -Keep Busy -don't smoke in the house or in the car        Depression Screen PHQ 2/9 Scores 05/06/2021 01/16/2021 12/04/2020  PHQ - 2 Score 0 0 0    Fall Risk Fall Risk  05/06/2021 01/16/2021 12/04/2020 12/04/2020  Falls in the past year? 1 0 - 0  Number falls in past yr: 0 0 - 0  Injury with Fall? 1 0 - 0  Risk for fall due to : No Fall Risks - No Fall Risks No Fall Risks  Follow up Falls evaluation completed - Falls evaluation completed;Falls prevention discussed Falls evaluation completed    FALL RISK PREVENTION PERTAINING TO THE HOME:  Any stairs in or around the home? Yes  If so, are there any without handrails? No  Home free of loose throw rugs in walkways, pet beds, electrical cords, etc? Yes  Adequate lighting in your home to reduce risk of falls? Yes   ASSISTIVE DEVICES UTILIZED TO PREVENT FALLS:  Life alert? No  Use of a cane, walker or w/c? No  Gait steady and fast without use of assistive device  Cognitive Function:     6CIT Screen 05/06/2021  What Year? 0 points  What month? 0 points  What time? 0 points  Count back from 20 0 points  Months in reverse 0 points  Repeat phrase 0 points  Total Score 0    Immunizations Immunization History  Administered Date(s) Administered   Fluad Quad(high Dose 65+) 04/22/2021   Influenza,inj,Quad PF,6+ Mos 03/15/2019   Pneumococcal Conjugate-13 03/15/2019   Pneumococcal Polysaccharide-23 01/16/2021    TDAP status: Due, Education has been provided  regarding the importance of this vaccine. Advised may receive this vaccine at local pharmacy or Health Dept. Aware to provide a copy of the vaccination record if obtained from local pharmacy or Health Dept. Verbalized acceptance and understanding.  Flu Vaccine status: Up to date  Pneumococcal vaccine status: Up to date  Covid-19 vaccine status: Declined, Education has been provided regarding the importance of this vaccine but patient still declined. Advised may receive this vaccine at local pharmacy or Health Dept.or vaccine clinic. Aware to provide a copy of the vaccination record if obtained from local pharmacy or Health Dept. Verbalized acceptance and understanding.  Screening Tests Health  Maintenance  Topic Date Due   Hepatitis C Screening  Never done   TETANUS/TDAP  Never done   Zoster Vaccines- Shingrix (1 of 2) Never done   Fecal DNA (Cologuard)  03/27/2022   MAMMOGRAM  01/29/2023   Pneumonia Vaccine 41+ Years old  Completed   INFLUENZA VACCINE  Completed   DEXA SCAN  Completed   HPV VACCINES  Aged Out   COVID-19 Vaccine  Discontinued    Health Maintenance  Health Maintenance Due  Topic Date Due   Hepatitis C Screening  Never done   TETANUS/TDAP  Never done   Zoster Vaccines- Shingrix (1 of 2) Never done    Colorectal cancer screening: Type of screening: Cologuard. Completed 03/28/2019. Repeat every 3 years  Mammogram status: Completed 01/2021. Repeat every year  Bone Density status: Scheduled to be done tomorrow  Lung Cancer Screening: (Low Dose CT Chest recommended if Age 81-80 years, 30 pack-year currently smoking OR have quit w/in 15years.) does not qualify.   Additional Screening:  Vision Screening: Recommended annual ophthalmology exams for early detection of glaucoma and other disorders of the eye. Is the patient up to date with their annual eye exam?  Yes    Dental Screening: Recommended annual dental exams for proper oral hygiene    Plan:    1-  Mammogram due 01/2022 2- Cologuard due 03/2022 3- Tdap - recommended to get at pharmacy 4- Patient declined COVID Vaccine series 5- Printed information given about advance directive 6- Discussed and printed information given about smoking secession 7- Daily exercise recommended, patient is active at home   I have personally reviewed and noted the following in the patient's chart:   Medical and social history Use of alcohol, tobacco or illicit drugs  Current medications and supplements including opioid prescriptions.  Functional ability and status Nutritional status Physical activity Advanced directives List of other physicians Hospitalizations, surgeries, and ER visits in previous 12 months Vitals Screenings to include cognitive, depression, and falls Referrals and appointments  In addition, I have reviewed and discussed with patient certain preventive protocols, quality metrics, and best practice recommendations. A written personalized care plan for preventive services as well as general preventive health recommendations were provided to patient.     Jacklynn Bue, LPN   [phone]

## 2021-05-07 DIAGNOSIS — N959 Unspecified menopausal and perimenopausal disorder: Secondary | ICD-10-CM | POA: Diagnosis not present

## 2021-05-07 DIAGNOSIS — M85851 Other specified disorders of bone density and structure, right thigh: Secondary | ICD-10-CM | POA: Diagnosis not present

## 2021-05-07 LAB — HM DEXA SCAN: HM Dexa Scan: ABNORMAL

## 2021-05-20 DIAGNOSIS — H52223 Regular astigmatism, bilateral: Secondary | ICD-10-CM | POA: Diagnosis not present

## 2021-06-08 DIAGNOSIS — J439 Emphysema, unspecified: Secondary | ICD-10-CM | POA: Diagnosis not present

## 2021-06-08 DIAGNOSIS — R051 Acute cough: Secondary | ICD-10-CM | POA: Diagnosis not present

## 2021-06-08 DIAGNOSIS — J449 Chronic obstructive pulmonary disease, unspecified: Secondary | ICD-10-CM | POA: Diagnosis not present

## 2021-06-08 DIAGNOSIS — R059 Cough, unspecified: Secondary | ICD-10-CM | POA: Diagnosis not present

## 2021-06-08 DIAGNOSIS — R062 Wheezing: Secondary | ICD-10-CM | POA: Diagnosis not present

## 2022-02-12 LAB — FECAL OCCULT BLOOD, IMMUNOCHEMICAL: IFOBT: NEGATIVE

## 2022-02-20 ENCOUNTER — Encounter: Payer: Self-pay | Admitting: Family Medicine

## 2022-06-24 ENCOUNTER — Telehealth: Payer: Self-pay | Admitting: Family Medicine

## 2022-06-24 NOTE — Telephone Encounter (Signed)
Left message for patient to call back and schedule Medicare Annual Wellness Visit (AWV) either virtually or phone with [employer] . Left  my Gabriella Gonzalez number [phone]   Last AWV  05/06/21 please schedule with Nurse Health Adviser   45 min for awv-i and in office appointments 30 min for awv-s  phone/virtual appointments

## 2022-07-08 ENCOUNTER — Encounter: Payer: Self-pay | Admitting: Family Medicine

## 2022-07-08 ENCOUNTER — Telehealth (INDEPENDENT_AMBULATORY_CARE_PROVIDER_SITE_OTHER): Payer: Medicare HMO | Admitting: Family Medicine

## 2022-07-08 VITALS — Ht 62.0 in

## 2022-07-08 DIAGNOSIS — Z0000 Encounter for general adult medical examination without abnormal findings: Secondary | ICD-10-CM | POA: Diagnosis not present

## 2022-07-08 NOTE — Progress Notes (Signed)
PATIENT CHECK-IN and HEALTH RISK ASSESSMENT QUESTIONNAIRE:  -completed by phone/video for upcoming Medicare Preventive Visit  Pre-Visit Check-in: 1)Vitals (height, wt, BP, etc) - record in vitals section for visit on day of visit 2)Review and Update Medications, Allergies PMH, Surgeries, Social history in Epic 3)Hospitalizations in the last year with date/reason? no  4)Review and Update Care Team (patient's specialists) in Epic 5) Complete PHQ9 in Epic  6) Complete Fall Screening in Epic 7)Review all Health Maintenance Due and order under PCP if not done.  8)Medicare Wellness Questionnaire: Answer theses question about your habits: Do you drink alcohol? sometimes If yes, how many drinks do you have a day?1 or 2 Have you ever smoked?yes Quit date if applicable? current  How many packs a day do/did you smoke? 1/2 pack, husband smokes too, she has thought about quitting - for her grandkids as she hides it from them Do you use smokeless tobacco?no Do you use an illicit drugs?no Do you exercises? No IF so, what type and how many days/minutes per week?na - goes up and down 3 levels of stairs "100 times per day" doing housework.  Are you sexually active? No Number of partners?0 Usually eats at home - home cooked meals, always cooks vegetables with meals Typical breakfast - eggs, grits, oat meal, toast Typical lunch- no lunch Typical dinner - chicken, pork chops, corn bread, greens Typical snacks: chips  Beverages: water  Answer theses question about you: Can you perform most household chores? yes Do you find it hard to follow a conversation in a noisy room? no Do you often ask people to speak up or repeat themselves?no Do you feel that you have a problem with memory? no Do you balance your checkbook and or bank acounts? yes Do you feel safe at home? yes Last dentist visit? 04/2022 Do you need assistance with any of the following: Please note if so  - No  Driving?  Feeding  yourself?  Getting from bed to chair?  Getting to the toilet?  Bathing or showering?  Dressing yourself?  Managing money?  Climbing a flight of stairs  Preparing meals?  Do you have Advanced Directives in place (Living Will, Healthcare Power or Attorney)? no   Last eye Exam and location? 11/23 Dr Gilford Rile in [place]   Do you currently use prescribed or non-prescribed narcotic or opioid pain medications?no  Do you have a history or close family history of breast, ovarian, tubal or peritoneal cancer or a family member with BRCA (breast cancer susceptibility 1 and 2) gene mutations? no  Nurse/Assistant Credentials/time stamp: Westley Hummer CMA   ----------------------------------------------------------------------------------------------------------------------------------------------------------------------------------------------------------------------   [place]: (Welcome to Medicare, initial annual wellness or annual wellness exam)  Virtual Visit via Video Note  I connected with Jurni  on 07/28/22 by a video enabled telemedicine application and verified that I am speaking with the correct person using two identifiers.  Location patient: home Location provider:work or home office Persons participating in the virtual visit: patient, provider  Concerns and/or follow up today: none, over holidays felt flu like for a few days - chills, fatigue. Fine now.   See HM section in Epic for other details of completed HM.    ROS: negative for report of fevers, unintentional weight loss, vision changes, vision loss, hearing loss or change, chest pain, sob, hemoptysis, melena, hematochezia, hematuria, genital discharge or lesions, falls, bleeding or bruising, loc, thoughts of suicide or self harm, memory loss  Patient-completed extensive health risk assessment -  reviewed and discussed with the patient: See Health Risk Assessment completed with  patient prior to the visit either above or in recent phone note. This was reviewed in detailed with the patient today and appropriate recommendations, orders and referrals were placed as needed per Summary below and patient instructions.   Review of Medical History: -PMH, PSH, Family History and current specialty and care providers reviewed and updated and listed below   Patient Care Team: Rochel Brome, MD as PCP - General (Family Medicine)   Past Medical History:  Diagnosis Date   Benign hypertension    Bronchitis     Past Surgical History:  Procedure Laterality Date   BREAST ENHANCEMENT SURGERY      Social History   Socioeconomic History   Marital status: Married    Spouse name: Alaiah Lundy   Number of children: 2   Years of education: Not on file   Highest education level: Not on file  Occupational History   Occupation: Retired  Tobacco Use   Smoking status: Every Day    Packs/day: 0.50    Types: Cigarettes   Smokeless tobacco: Never  Substance and Sexual Activity   Alcohol use: Yes    Alcohol/week: 14.0 standard drinks of alcohol    Types: 14 Standard drinks or equivalent per week    Comment: 1-2 per day   Drug use: Never   Sexual activity: Not on file  Other Topics Concern   Not on file  Social History Narrative   Not on file   Social Determinants of Health   Financial Resource Strain: Not on file  Food Insecurity: No Food Insecurity (05/06/2021)   Hunger Vital Sign    Worried About Running Out of Food in the Last Year: Never true    Ran Out of Food in the Last Year: Never true  Transportation Needs: No Transportation Needs (05/06/2021)   PRAPARE - Hydrologist (Medical): No    Lack of Transportation (Non-Medical): No  Physical Activity: Inactive (05/06/2021)   Exercise Vital Sign    Days of Exercise per Week: 0 days    Minutes of Exercise per Session: 0 min  Stress: Not on file  Social Connections: Not on file   Intimate Partner Violence: Not on file    Family History  Problem Relation Age of Onset   COPD Mother    Cancer Father        bladder    No current outpatient medications on file prior to visit.   No current facility-administered medications on file prior to visit.    Allergies  Allergen Reactions   Amlodipine     Pedal edema    Lisinopril     Pedal edema       Physical Exam There were no vitals filed for this visit. Estimated body mass index is 21.14 kg/m as calculated from the following:   Height as of this encounter: _0  (1.575 m).   Weight as of 05/06/21: 115 lb 9.6 oz (52.4 kg).  EKG (optional): deferred due to virtual visit  GENERAL: alert, oriented, no audible sounds of distress. Eye exam deferred due to virtual visit.   PSYCH/NEURO: pleasant and cooperative, no obvious depression or anxiety, speech and thought processing grossly intact, Cognitive function grossly intact  Flowsheet Row Video Visit from 07/08/2022 in [facility]  PHQ-9 Total Score 2           07/08/2022    2:20 PM 05/06/2021  3:17 PM 01/16/2021    9:40 AM 12/04/2020    1:54 PM  Depression screen PHQ 2/9  Decreased Interest 0 0 0 0  Down, Depressed, Hopeless 0 0 0 0  PHQ - 2 Score 0 0 0 0  Altered sleeping 1     Tired, decreased energy 1     Change in appetite 0     Feeling bad or failure about yourself  0     Trouble concentrating 0     Moving slowly or fidgety/restless 0     Suicidal thoughts 0     PHQ-9 Score 2     Difficult doing work/chores Not difficult at all          12/04/2020    1:54 PM 12/04/2020    2:44 PM 01/16/2021    9:40 AM 05/06/2021    3:18 PM 07/08/2022    2:20 PM  [place] in the past year? 0  0 1 1  Was there an injury with Fall? 0  0 1 0  Fall Risk Category Calculator 0  0 2 1  Fall Risk Category Low  Low Moderate Low  Patient Fall Risk Level _0   Patient  at Risk for Falls Due to No Fall Risks No Fall Risks  No Fall Risks Impaired balance/gait  Fall risk Follow up Falls evaluation completed Falls evaluation completed;Falls prevention discussed  Falls evaluation completed Falls evaluation completed     SUMMARY AND PLAN:  Medicare annual wellness visit, subsequent   Discussed applicable health maintenance/preventive health measures and advised and referred or ordered per patient preferences:  Health Maintenance  Topic Date Due   Hepatitis C Screening  Never done   DTaP/Tdap/Td (1 - Tdap) She thinks she got this when grandkids were born about 5-7 years ago.   INFLUENZA VACCINE  02/04/2022, she decided not to get the flu shot this year because got sick after flu shot last year.    Zoster Vaccines- Shingrix (1 of 2) She thought she got this as well in the past   COLONOSCOPY (Pts 45-34yr Insurance coverage will need to be confirmed)  Doing FOBT instead   MAMMOGRAM  01/29/2023, she agrees to scheduled   [place]  [place]   Medicare Annual Wellness (AWV)  07/09/2023   Pneumonia Vaccine 67 Years old  Completed   DEXA SCAN  Completed   HPV VACCINES  Aged Out   COVID-19 Vaccine  Discontinued   Fecal DNA (Cologuard)  Discontinued    Education and counseling on the following was provided based on the above review of health and a plan/checklist for the patient, along with additional information discussed, was provided for the patient in the patient instructions :  -Advised on importance of and resources for completing advanced directives -Advised and counseled on maintaining healthy weight and healthy lifestyle - including the importance of a health diet, regular physical activity, social connections and stress management. -Advised and counseled on a whole foods based healthy diet and regular exercise: discussed a heart healthy whole foods based diet. A summary of a healthy diet was provided in the Patient  Instructions. -Recommended regular exercise and discussed options within the community.  -Advise yearly dental visits at minimum and regular eye exams -Advised and counseled on alcohol and tobacco. She seems to be considering quitting smoking. She wants to quit for her grandkids. Has quit before. Is  concerned abouts costs associated with quitting. Discussed smoking costs vs costs to quit, risks of continued smoking, etc. Advised to quit and discussed strategies. Provided quitline info and she is considering using nicotine replacement. Advised to let us know if wants further help.   Follow up: see patient instructions     Patient Instructions  I really enjoyed getting to talk with you today! I am available on Tuesdays and Thursdays for virtual visits if you have any questions or concerns, or if I can be of any further assistance.   CHECKLIST FROM ANNUAL WELLNESS VISIT:  -Follow up (please call to schedule if not scheduled after visit): -yearly for annual wellness visit with primary care office  Here is a list of your preventive care/health maintenance measures and the plan for each if any are due:  Health Maintenance  Topic Date Due   Hepatitis C Screening  Never done, can request when you get labs   DTaP/Tdap/Td (1 - Tdap) Never done, check to see if this was done in the past. Is due every 10 years.    INFLUENZA VACCINE  10/05/2022   Zoster Vaccines- Shingrix (1 of 2) 10/07/2022    MAMMOGRAM  Due, Please call [facility] at [phone] to schedule your appointment.   COLON CANCER SCREENING ANNUAL FOBT  02/13/2023   Medicare Annual Wellness (AWV)  07/09/2023   Pneumonia Vaccine 39+ Years old  Completed   DEXA SCAN  Completed   HPV VACCINES  Aged Out   COVID-19 Vaccine  Discontinued, can get at pharmacy if you change your mind.     -See a dentist at least yearly  -Get your eyes checked and then per your eye specialist's  recommendations  -Other issues addressed today: - Please call [facility] at [phone] to schedule your appointment. -Please quit smoking! Call the quitline for free help 1-800-QUIT-NOW  -I have included below further information regarding a healthy whole foods based diet, physical activity guidelines for adults, stress management and opportunities for social connections. I hope you find this information useful.   -----------------------------------------------------------------------------------------------------------------------------------------------------------------------------------------------------------------------------------------------------------  NUTRITION: -eat real food: lots of colorful vegetables (half the plate) and fruits -5-7 servings of vegetables and fruits per day (fresh or steamed is best), exp. 2 servings of vegetables with lunch and dinner and 2 servings of fruit per day. Berries and greens such as kale and collards are great choices.  -consume on a regular basis: whole grains (make sure first ingredient on label contains the word "whole"), fresh fruits, fish, nuts, seeds, healthy oils (such as olive oil, avocado oil, grape seed oil) -may eat small amounts of dairy and lean meat on occasion, but avoid processed meats such as ham, bacon, lunch meat, etc. -drink water -try to avoid fast food and pre-packaged foods, processed meat -most experts advise limiting sodium to < 2369m per day, should limit further is any chronic conditions such as high blood pressure, heart disease, diabetes, etc. The American Heart Association advised that < 15040mis is ideal -try to avoid foods that contain any ingredients with names you do not recognize  -try to avoid sugar/sweets (except for the natural sugar that occurs in fresh fruit) -try to avoid sweet drinks -try to avoid white rice, white bread, pasta (unless whole grain), white or  yellow potatoes  EXERCISE GUIDELINES FOR ADULTS: -if you wish to increase your physical activity, do so gradually and with the approval of your doctor -STOP  and seek medical care immediately if you have any chest pain, chest discomfort or trouble breathing when starting or increasing exercise  -move and stretch your body, legs, feet and arms when sitting for long periods -Physical activity guidelines for optimal health in adults: -least 150 minutes per week of aerobic exercise (can talk, but not sing) once approved by your doctor, 20-30 minutes of sustained activity or two 10 minute episodes of sustained activity every day.  -resistance training at least 2 days per week if approved by your doctor -balance exercises 3+ days per week:   Stand somewhere where you have something sturdy to hold onto if you lose balance.    1) lift up on toes, start with 5x per day and work up to 20x   2) stand and lift on leg straight out to the side so that foot is a few inches of the floor, start with 5x each side and work up to 20x each side   3) stand on one foot, start with 5 seconds each side and work up to 20 seconds on each side  If you need ideas or help with getting more active:  -Silver sneakers https://tools.silversneakers.com  -Walk with a Doc: http://stephens-thompson.biz/  -try to include resistance (weight lifting/strength building) and balance exercises twice per week: or the following link for ideas: ChessContest.fr  UpdateClothing.com.cy  STRESS MANAGEMENT: -can try meditating, or just sitting quietly with deep breathing while intentionally relaxing all parts of your body for 5 minutes daily -if you need further help with stress, anxiety or depression please follow up with your primary doctor or contact the wonderful folks at [place]: [place]: -options in  [place] if you wish to engage in more social and exercise related activities:  -Silver sneakers https://tools.silversneakers.com  -Walk with a Doc: http://stephens-thompson.biz/  -Check out the [place] 50+ section on the [place] [employer] (hiking clubs, book clubs, cards and games, chess, exercise classes, aquatic classes and much more) - see the website for details: https://www.[place].gov/departments/parks-recreation/active-adults50  -YouTube has lots of exercise videos for different ages and abilities as well  -[place] (a variety of indoor and outdoor inperson activities for adults). [phone]. [address]  -Virtual Online Classes (a variety of topics): see seniorplanet.org or call [phone]  -consider volunteering at a school, hospice center, church, senior center or elsewhere   ADVANCED HEALTHCARE DIRECTIVES:  Everyone should have advanced health care directives in place. This is so that you get the care you want, should you ever be in a situation where you are unable to make your own medical decisions.   From the [place] Advanced Directive Website: "[place] are legal documents in which you give written instructions about your health care if, in the future, you cannot speak for yourself.   A health care power of attorney allows you to name a person you trust to make your health care decisions if you cannot make them yourself. A declaration of a desire for a natural death (or living will) is document, which states that you desire not to have your life prolonged by extraordinary measures if you have a terminal or incurable illness or if you are in a vegetative state. An advance instruction for mental health treatment makes a declaration of instructions, information and preferences regarding your mental health treatment. It also states that you are aware that the advance instruction authorizes a mental  health treatment provider to act according to your  wishes. It may also outline your consent or refusal of mental health treatment. A declaration of an anatomical gift allows anyone over the age of 75 to make a gift by will, organ donor card or other document."   Please see the following website or an elder law attorney for forms, FAQs and for completion of advanced directives: [place] [employer] of [place] ([email or web address])  Or copy and paste the following to your web browser: PokerReunion.com.cy          Lucretia Kern, DO

## 2022-07-08 NOTE — Patient Instructions (Addendum)
I really enjoyed getting to talk with you today! I am available on Tuesdays and Thursdays for virtual visits if you have any questions or concerns, or if I can be of any further assistance.   CHECKLIST FROM ANNUAL WELLNESS VISIT:  -Follow up (please call to schedule if not scheduled after visit): -yearly for annual wellness visit with primary care office  Here is a list of your preventive care/health maintenance measures and the plan for each if any are due:  Health Maintenance  Topic Date Due   Hepatitis C Screening  Never done, can request when you get labs   DTaP/Tdap/Td (1 - Tdap) Never done, check to see if this was done in the past. Is due every 10 years.    INFLUENZA VACCINE  10/05/2022   Zoster Vaccines- Shingrix (1 of 2) 10/07/2022    MAMMOGRAM  Due, Please call [facility] at [phone] to schedule your appointment.   COLON CANCER SCREENING ANNUAL FOBT  02/13/2023   Medicare Annual Wellness (AWV)  07/09/2023   Pneumonia Vaccine 60+ Years old  Completed   DEXA SCAN  Completed   HPV VACCINES  Aged Out   COVID-19 Vaccine  Discontinued, can get at pharmacy if you change your mind.     -See a dentist at least yearly  -Get your eyes checked and then per your eye specialist's recommendations  -Other issues addressed today: - Please call [facility] at [phone] to schedule your appointment. -Please quit smoking! Call the quitline for free help 1-800-QUIT-NOW  -I have included below further information regarding a healthy whole foods based diet, physical activity guidelines for adults, stress management and opportunities for social connections. I hope you find this information useful.    -----------------------------------------------------------------------------------------------------------------------------------------------------------------------------------------------------------------------------------------------------------  NUTRITION: -eat real food: lots of colorful vegetables (half the plate) and fruits -5-7 servings of vegetables and fruits per day (fresh or steamed is best), exp. 2 servings of vegetables with lunch and dinner and 2 servings of fruit per day. Berries and greens such as kale and collards are great choices.  -consume on a regular basis: whole grains (make sure first ingredient on label contains the word "whole"), fresh fruits, fish, nuts, seeds, healthy oils (such as olive oil, avocado oil, grape seed oil) -may eat small amounts of dairy and lean meat on occasion, but avoid processed meats such as ham, bacon, lunch meat, etc. -drink water -try to avoid fast food and pre-packaged foods, processed meat -most experts advise limiting sodium to < 2300mg  per day, should limit further is any chronic conditions such as high blood pressure, heart disease, diabetes, etc. The American Heart Association advised that < 1500mg  is is ideal -try to avoid foods that contain any ingredients with names you do not recognize  -try to avoid sugar/sweets (except for the natural sugar that occurs in fresh fruit) -try to avoid sweet drinks -try to avoid white rice, white bread, pasta (unless whole grain), white or yellow potatoes  EXERCISE GUIDELINES FOR ADULTS: -if you wish to increase your physical activity, do so gradually and with the approval of your doctor -STOP and seek medical care immediately if you have any chest pain, chest discomfort or trouble breathing when starting or increasing exercise  -move and stretch your body, legs, feet and arms when sitting for long periods -Physical activity guidelines for optimal health in adults: -least 150 minutes per week of  aerobic exercise (can talk, but not sing)  once approved by your doctor, 20-30 minutes of sustained activity or two 10 minute episodes of sustained activity every day.  -resistance training at least 2 days per week if approved by your doctor -balance exercises 3+ days per week:   Stand somewhere where you have something sturdy to hold onto if you lose balance.    1) lift up on toes, start with 5x per day and work up to 20x   2) stand and lift on leg straight out to the side so that foot is a few inches of the floor, start with 5x each side and work up to 20x each side   3) stand on one foot, start with 5 seconds each side and work up to 20 seconds on each side  If you need ideas or help with getting more active:  -Silver sneakers https://tools.silversneakers.com  -Walk with a Doc: http://stephens-thompson.biz/  -try to include resistance (weight lifting/strength building) and balance exercises twice per week: or the following link for ideas: ChessContest.fr  UpdateClothing.com.cy  STRESS MANAGEMENT: -can try meditating, or just sitting quietly with deep breathing while intentionally relaxing all parts of your body for 5 minutes daily -if you need further help with stress, anxiety or depression please follow up with your primary doctor or contact the wonderful folks at [place]: [place]: -options in [place] if you wish to engage in more social and exercise related activities:  -Silver sneakers https://tools.silversneakers.com  -Walk with a Doc: http://stephens-thompson.biz/  -Check out the [place] 50+ section on the [place] of [employer] (hiking clubs, book clubs, cards and games, chess, exercise classes, aquatic classes and much more) - see the website for  details: https://www.[place].gov/departments/parks-recreation/active-adults50  -YouTube has lots of exercise videos for different ages and abilities as well  -[place] (a variety of indoor and outdoor inperson activities for adults). [phone]. [address].  -Virtual Online Classes (a variety of topics): see seniorplanet.org or call [phone]  -consider volunteering at a school, hospice center, church, senior center or elsewhere   ADVANCED HEALTHCARE DIRECTIVES:  Everyone should have advanced health care directives in place. This is so that you get the care you want, should you ever be in a situation where you are unable to make your own medical decisions.   From the  Advanced Directive Website: "[place] are legal documents in which you give written instructions about your health care if, in the future, you cannot speak for yourself.   A health care power of attorney allows you to name a person you trust to make your health care decisions if you cannot make them yourself. A declaration of a desire for a natural death (or living will) is document, which states that you desire not to have your life prolonged by extraordinary measures if you have a terminal or incurable illness or if you are in a vegetative state. An advance instruction for mental health treatment makes a declaration of instructions, information and preferences regarding your mental health treatment. It also states that you are aware that the advance instruction authorizes a mental health treatment provider to act according to your wishes. It may also outline your consent or refusal of mental health treatment. A declaration of an anatomical gift allows anyone over the age of 53 to make a gift by will, organ donor card or other document."   Please see the following website or an elder law attorney for forms, FAQs and for completion of advanced  directives: [place] Secretary of [place] ([email or web address])  Or copy and paste the following to your web browser: [email or web address]

## 2022-07-22 NOTE — Progress Notes (Signed)
Acute Office Visit  Subjective:    Patient ID: Gabriella Gonzalez, female    DOB: 14-May-1956, 67 y.o.   MRN: [id number]  Chief Complaint  Patient presents with   Left sided back pain    HPI Patient is in today for left sided back pain and left leg pain x 1 week. No injury. Tried ibuprofen. Helped, but still going on. Ibuprofen 200 mg 2 every 4-5 hours works. Pain waxes and wanes.  Past Medical History:  Diagnosis Date   Benign hypertension    Bronchitis     Past Surgical History:  Procedure Laterality Date   BREAST ENHANCEMENT SURGERY      Family History  Problem Relation Age of Onset   COPD Mother    Cancer Father        bladder    Social History   Socioeconomic History   Marital status: Married    Spouse name: Juel Ripley   Number of children: 2   Years of education: Not on file   Highest education level: Not on file  Occupational History   Occupation: Retired  Tobacco Use   Smoking status: Every Day    Packs/day: 0.50    Types: Cigarettes   Smokeless tobacco: Never  Substance and Sexual Activity   Alcohol use: Yes    Alcohol/week: 14.0 standard drinks of alcohol    Types: 14 Standard drinks or equivalent per week    Comment: 1-2 per day   Drug use: Never   Sexual activity: Not on file  Other Topics Concern   Not on file  Social History Narrative   Not on file   Social Determinants of Health   Financial Resource Strain: Not on file  Food Insecurity: No Food Insecurity (05/06/2021)   Hunger Vital Sign    Worried About Running Out of Food in the Last Year: Never true    Ran Out of Food in the Last Year: Never true  Transportation Needs: No Transportation Needs (05/06/2021)   PRAPARE - Hydrologist (Medical): No    Lack of Transportation (Non-Medical): No  Physical Activity: Inactive (05/06/2021)   Exercise Vital Sign    Days of Exercise per Week: 0 days    Minutes of Exercise per Session: 0 min  Stress: Not on file   Social Connections: Not on file  Intimate Partner Violence: Not on file    No outpatient medications prior to visit.   No facility-administered medications prior to visit.    Allergies  Allergen Reactions   Amlodipine     Pedal edema    Lisinopril     Pedal edema    Review of Systems  Constitutional:  Positive for fatigue. Negative for appetite change, chills and fever.  HENT:  Negative for congestion.   Respiratory:  Negative for cough and shortness of breath.   Cardiovascular:  Positive for palpitations (occasionally for years.). Negative for chest pain.  Gastrointestinal:  Negative for abdominal pain, blood in stool, constipation, diarrhea, nausea and vomiting.  Genitourinary:  Negative for dysuria and hematuria.  Musculoskeletal:  Positive for arthralgias (right shoulder pain. Pt was trying to take her coat off and it got stuck and then she jerked her shoulder out and had pain. Ibuprofen does not help.) and back pain (left sided).  Skin:  Negative for rash.  Neurological:  Positive for light-headedness. Negative for dizziness, weakness and headaches.  Psychiatric/Behavioral:  Negative for dysphoric mood. The patient is not nervous/anxious.  Objective:    Physical Exam Vitals reviewed.  Constitutional:      Appearance: Normal appearance. She is normal weight.  Cardiovascular:     Rate and Rhythm: Normal rate and regular rhythm.     Heart sounds: Normal heart sounds.  Pulmonary:     Effort: Pulmonary effort is normal.     Breath sounds: Normal breath sounds.  Abdominal:     General: Abdomen is flat. Bowel sounds are normal.     Palpations: Abdomen is soft.     Tenderness: There is no abdominal tenderness.  Musculoskeletal:        General: Tenderness (Left lumbar.  Left buttock.  Negative straight leg raise bilaterally.  No tenderness over the rest of her back.) present.     Comments: Right shoulder: Full range of motion with some discomfort with abduction  and internal rotation.  Negative empty can.  Left shoulder normal.  Neurological:     Mental Status: She is alert and oriented to person, place, and time.  Psychiatric:        Mood and Affect: Mood normal.        Behavior: Behavior normal.     BP (!) 92/52   Pulse 88   Temp (!) 97.1 F (36.2 C)   Resp 14   Ht 5\' 2"  (1.575 m)   Wt 115 lb 9.6 oz (52.4 kg)   BMI 21.14 kg/m  Wt Readings from Last 3 Encounters:  07/23/22 115 lb 9.6 oz (52.4 kg)  05/06/21 115 lb 9.6 oz (52.4 kg)  04/22/21 113 lb (51.3 kg)    Health Maintenance Due  Topic Date Due   Hepatitis C Screening  Never done   DTaP/Tdap/Td (1 - Tdap) Never done    There are no preventive care reminders to display for this patient.   Lab Results  Component Value Date   TSH 2.610 07/23/2022   Lab Results  Component Value Date   WBC 10.8 07/23/2022   HGB 11.5 07/23/2022   HCT 35.2 07/23/2022   MCV 90 07/23/2022   PLT 433 07/23/2022   Lab Results  Component Value Date   NA 139 07/23/2022   K 4.5 07/23/2022   CO2 21 07/23/2022   GLUCOSE 93 07/23/2022   BUN 16 07/23/2022   CREATININE 1.07 (H) 07/23/2022   BILITOT 0.7 07/23/2022   ALKPHOS 302 (H) 07/23/2022   AST 16 07/23/2022   ALT 15 07/23/2022   PROT 6.5 07/23/2022   ALBUMIN 3.5 (L) 07/23/2022   CALCIUM 9.3 07/23/2022   EGFR 57 (L) 07/23/2022   Lab Results  Component Value Date   CHOL 167 01/16/2021   Lab Results  Component Value Date   HDL 70 01/16/2021   Lab Results  Component Value Date   LDLCALC 75 01/16/2021   Lab Results  Component Value Date   TRIG 127 01/16/2021   Lab Results  Component Value Date   CHOLHDL 2.4 01/16/2021   No results found for: "HGBA1C"       Assessment & Plan:  Acute cystitis with hematuria Prescription: macrobid. Urine culture came back and was not sensitive to macrobid.  Prescription: cefdinir prescription sent on 07/26/2022.  Acute pain of right shoulder Right shoulder pain: Prednisone  prescription Exercises given. If no improvement with medication in 1 to 2 weeks, get lumbar x-ray at [facility].  Order given.  Lumbar back pain Prescription: prednisone and tizanidine. If no improvement with medication in 1 to 2 weeks, get lumbar x-ray at  [facility].  Order given. Exercises given.  Lightheadedness Low normal bp. Recommend increase water intake. Recommend labs  Meds ordered this encounter  Medications   predniSONE (DELTASONE) 50 MG tablet    Sig: Take 1 tablet (50 mg total) by mouth daily with breakfast.    Dispense:  5 tablet    Refill:  0   tizanidine (ZANAFLEX) 2 MG capsule    Sig: Take 1 capsule (2 mg total) by mouth 3 (three) times daily.    Dispense:  30 capsule    Refill:  0   nitrofurantoin, macrocrystal-monohydrate, (MACROBID) 100 MG capsule    Sig: Take 1 capsule (100 mg total) by mouth 2 (two) times daily for 5 days.    Dispense:  10 capsule    Refill:  0   Follow up: [facility].  Blane Ohara, MD

## 2022-07-23 ENCOUNTER — Ambulatory Visit (INDEPENDENT_AMBULATORY_CARE_PROVIDER_SITE_OTHER): Payer: Medicare HMO | Admitting: Family Medicine

## 2022-07-23 VITALS — BP 92/52 | HR 88 | Temp 97.1°F | Resp 14 | Ht 62.0 in | Wt 115.6 lb

## 2022-07-23 DIAGNOSIS — M25511 Pain in right shoulder: Secondary | ICD-10-CM | POA: Diagnosis not present

## 2022-07-23 DIAGNOSIS — N3001 Acute cystitis with hematuria: Secondary | ICD-10-CM

## 2022-07-23 DIAGNOSIS — R42 Dizziness and giddiness: Secondary | ICD-10-CM | POA: Diagnosis not present

## 2022-07-23 DIAGNOSIS — M545 Low back pain, unspecified: Secondary | ICD-10-CM

## 2022-07-23 LAB — POCT URINALYSIS DIPSTICK
Bilirubin, UA: NEGATIVE
Glucose, UA: NEGATIVE
Ketones, UA: NEGATIVE
Nitrite, UA: POSITIVE
Protein, UA: POSITIVE — AB
Spec Grav, UA: 1.015 (ref 1.010–1.025)
Urobilinogen, UA: 0.2 E.U./dL
pH, UA: 5.5 (ref 5.0–8.0)

## 2022-07-23 MED ORDER — TIZANIDINE HCL 2 MG PO CAPS: 2.0000 mg | ORAL_CAPSULE | Freq: Three times a day (TID) | ORAL | 0 refills | Status: DC

## 2022-07-23 MED ORDER — NITROFURANTOIN MONOHYD MACRO 100 MG PO CAPS
100.0000 mg | ORAL_CAPSULE | Freq: Two times a day (BID) | ORAL | 0 refills | Status: AC
Start: 2022-07-23 — End: 2022-07-28

## 2022-07-23 MED ORDER — PREDNISONE 50 MG PO TABS: 50.0000 mg | ORAL_TABLET | Freq: Every day | ORAL | 0 refills | Status: DC

## 2022-07-23 NOTE — Patient Instructions (Signed)
Low back pain: Prednisone 50 mg 1 p.o. daily x 5 days.  Tizanidine 2 mg 3 times daily as needed back pain.  (This is a muscle relaxant and can cause sedation, so only take it home.) May use acetaminophen (Tylenol) as directed on the bottle if needed for pain relief.  No ibuprofen or Aleve while on prednisone. Exercises given If no improvement with medication in 1 to 2 weeks, get lumbar x-ray at [facility].  Order given.  Right shoulder pain: Prednisone prescription Exercises given. If no improvement with medication in 1 to 2 weeks, get lumbar x-ray at [facility].  Order given.  Lightheadedness: Low normal blood pressure.  Check labs.  Urinary tract infection: Prescription for Macrobid 100 mg twice daily x 5 days.

## 2022-07-24 ENCOUNTER — Other Ambulatory Visit: Payer: Self-pay

## 2022-07-24 DIAGNOSIS — R748 Abnormal levels of other serum enzymes: Secondary | ICD-10-CM

## 2022-07-24 LAB — COMPREHENSIVE METABOLIC PANEL
ALT: 15 IU/L (ref 0–32)
AST: 16 IU/L (ref 0–40)
Albumin/Globulin Ratio: 1.2 (ref 1.2–2.2)
Albumin: 3.5 g/dL — ABNORMAL LOW (ref 3.9–4.9)
Alkaline Phosphatase: 302 IU/L — ABNORMAL HIGH (ref 44–121)
BUN/Creatinine Ratio: 15 (ref 12–28)
BUN: 16 mg/dL (ref 8–27)
Bilirubin Total: 0.7 mg/dL (ref 0.0–1.2)
CO2: 21 mmol/L (ref 20–29)
Calcium: 9.3 mg/dL (ref 8.7–10.3)
Chloride: 101 mmol/L (ref 96–106)
Creatinine, Ser: 1.07 mg/dL — ABNORMAL HIGH (ref 0.57–1.00)
Globulin, Total: 3 g/dL (ref 1.5–4.5)
Glucose: 93 mg/dL (ref 70–99)
Potassium: 4.5 mmol/L (ref 3.5–5.2)
Sodium: 139 mmol/L (ref 134–144)
Total Protein: 6.5 g/dL (ref 6.0–8.5)
eGFR: 57 mL/min/{1.73_m2} — ABNORMAL LOW (ref >59)

## 2022-07-24 LAB — CBC WITH DIFFERENTIAL/PLATELET
Basophils Absolute: 0 10*3/uL (ref 0.0–0.2)
Basos: 0 %
EOS (ABSOLUTE): 0.1 10*3/uL (ref 0.0–0.4)
Eos: 1 %
Hematocrit: 35.2 % (ref 34.0–46.6)
Hemoglobin: 11.5 g/dL (ref 11.1–15.9)
Immature Grans (Abs): 0.1 10*3/uL (ref 0.0–0.1)
Immature Granulocytes: 1 %
Lymphocytes Absolute: 1.4 10*3/uL (ref 0.7–3.1)
Lymphs: 13 %
MCH: 29.5 pg (ref 26.6–33.0)
MCHC: 32.7 g/dL (ref 31.5–35.7)
MCV: 90 fL (ref 79–97)
Monocytes Absolute: 0.9 10*3/uL (ref 0.1–0.9)
Monocytes: 9 %
Neutrophils Absolute: 8.4 10*3/uL — ABNORMAL HIGH (ref 1.4–7.0)
Neutrophils: 76 %
Platelets: 433 10*3/uL (ref 150–450)
RBC: 3.9 x10E6/uL (ref 3.77–5.28)
RDW: 14 % (ref 11.7–15.4)
WBC: 10.8 10*3/uL (ref 3.4–10.8)

## 2022-07-24 LAB — TSH: TSH: 2.61 u[IU]/mL (ref 0.450–4.500)

## 2022-07-24 NOTE — Progress Notes (Signed)
Discussed with Lemmie Evens

## 2022-07-25 LAB — URINE CULTURE

## 2022-07-26 ENCOUNTER — Encounter: Payer: Self-pay | Admitting: Family Medicine

## 2022-07-27 ENCOUNTER — Encounter: Payer: Self-pay | Admitting: Family Medicine

## 2022-07-27 NOTE — Assessment & Plan Note (Addendum)
Low normal bp. Recommend increase water intake. Recommend labs

## 2022-07-27 NOTE — Assessment & Plan Note (Addendum)
Right shoulder pain: Prednisone prescription Exercises given. If no improvement with medication in 1 to 2 weeks, get lumbar x-ray at [facility].  Order given.

## 2022-07-27 NOTE — Assessment & Plan Note (Signed)
Prescription: macrobid. Urine culture came back and was not sensitive to macrobid.  Prescription: cefdinir prescription sent on 07/26/2022.

## 2022-07-27 NOTE — Assessment & Plan Note (Addendum)
Prescription: prednisone and tizanidine. If no improvement with medication in 1 to 2 weeks, get lumbar x-ray at [facility].  Order given. Exercises given.

## 2022-07-30 ENCOUNTER — Telehealth: Payer: Self-pay

## 2022-07-30 DIAGNOSIS — R109 Unspecified abdominal pain: Secondary | ICD-10-CM | POA: Diagnosis not present

## 2022-07-30 DIAGNOSIS — N1330 Unspecified hydronephrosis: Secondary | ICD-10-CM | POA: Diagnosis not present

## 2022-07-30 DIAGNOSIS — I700 Atherosclerosis of aorta: Secondary | ICD-10-CM | POA: Diagnosis not present

## 2022-07-30 NOTE — Telephone Encounter (Signed)
Patient called this morning stating that her left side pain was worse and that the antibiotics she was on for her UTI was not helping and that her pain was a 9 on a scale of 0-10. I told patient that we currently did not have any openings and considering the severity of her pain she needed to go to the nearest ER and be checked out. Patient agreed to go.

## 2022-07-31 ENCOUNTER — Other Ambulatory Visit: Payer: Medicare HMO

## 2022-08-02 IMAGING — MG DIGITAL SCREENING IMPLANTS W/ CAD
9 of 12 series · 9 of 28 positions shown · non-contrast
Comparison: Previous exam(s).

CLINICAL DATA: Screening.

EXAM:
DIGITAL SCREENING BILATERAL MAMMOGRAM WITH IMPLANTS AND CAD
TECHNIQUE: Bilateral screening digital craniocaudal and mediolateral oblique
mammograms were obtained. The images were evaluated with
computer-aided detection. Standard and/or implant displaced views
were performed.

[R CC]
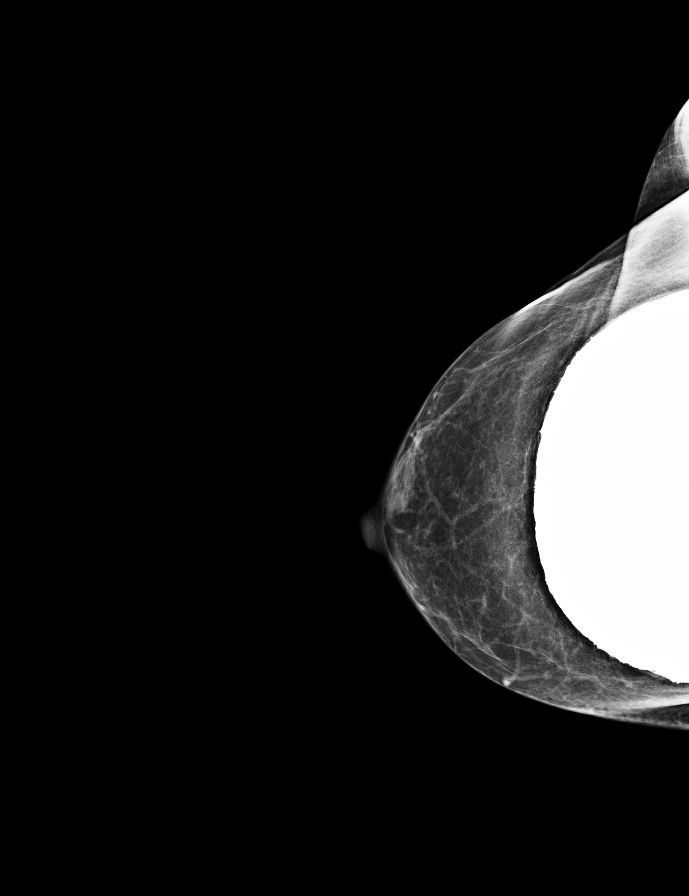

[L MLO]
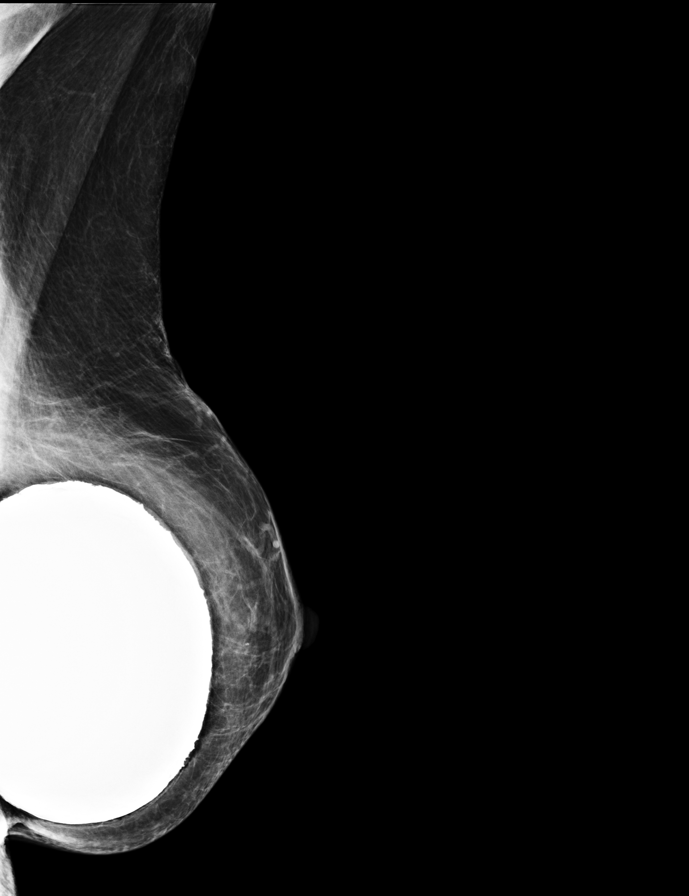

[R MLO]
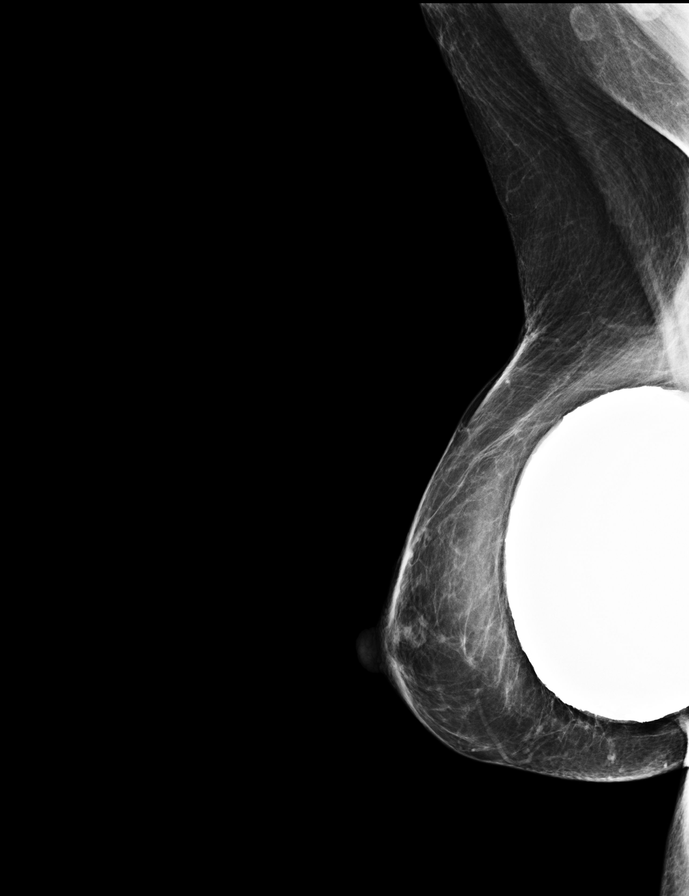

[L CC]
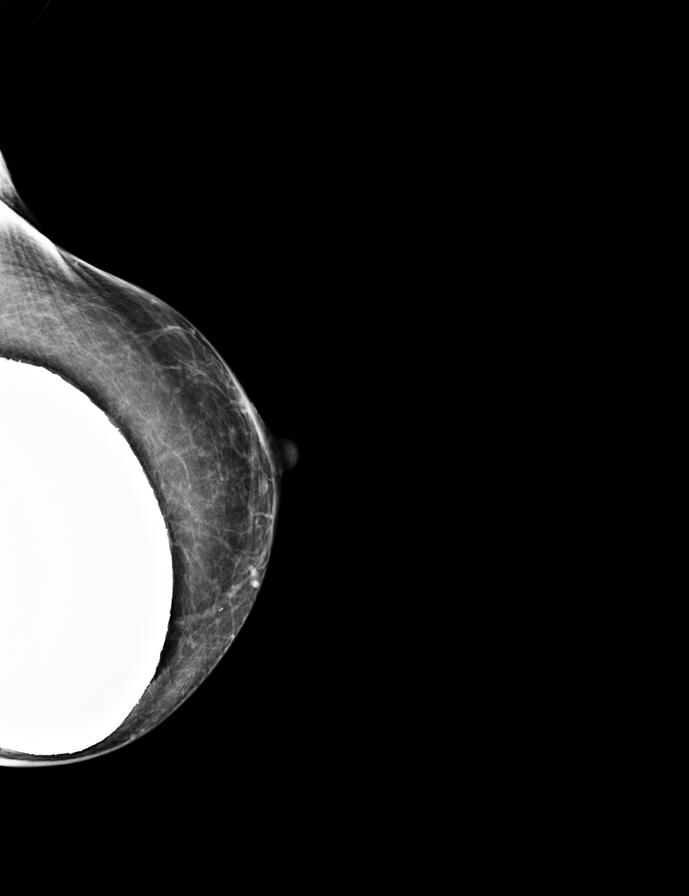

[L CC synth-2D]
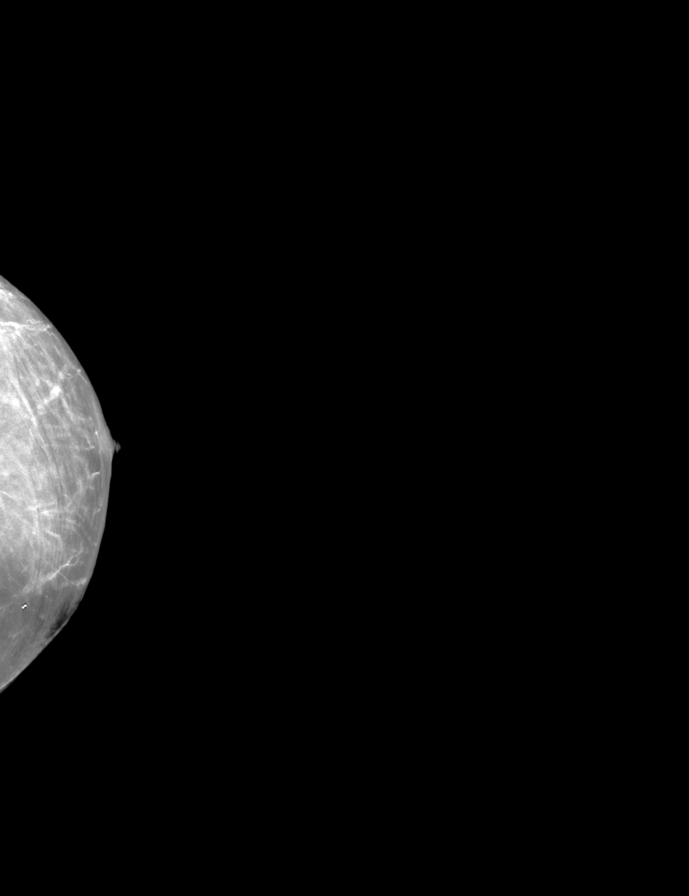

[R MLO synth-2D]
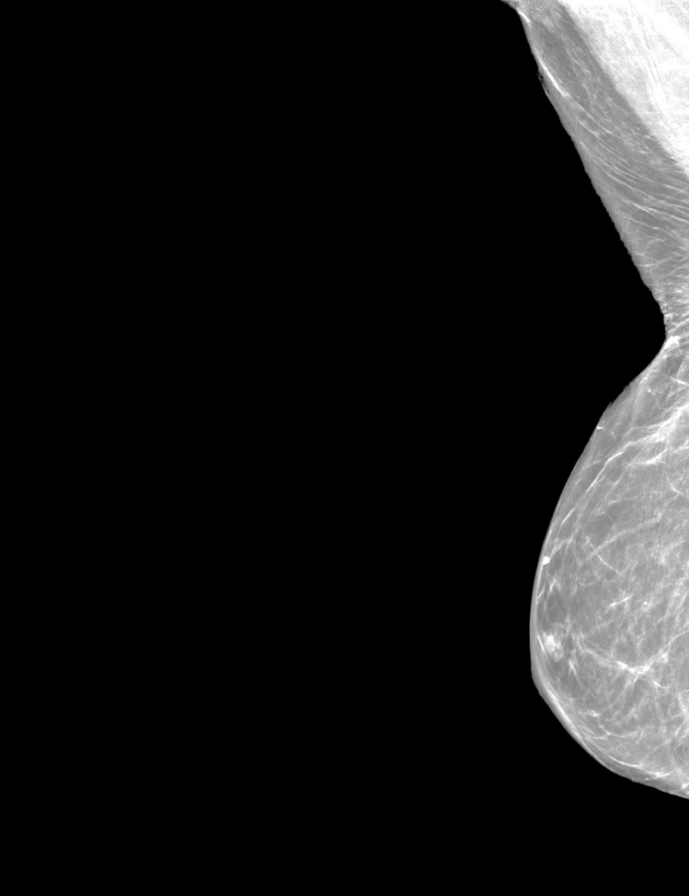

[L MLO synth-2D]
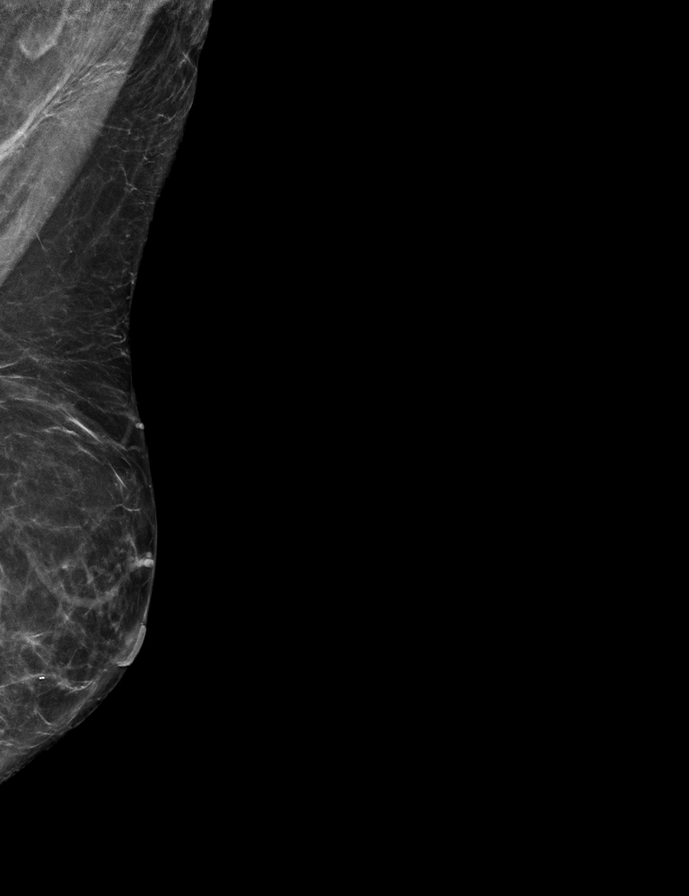

[R CC synth-2D]
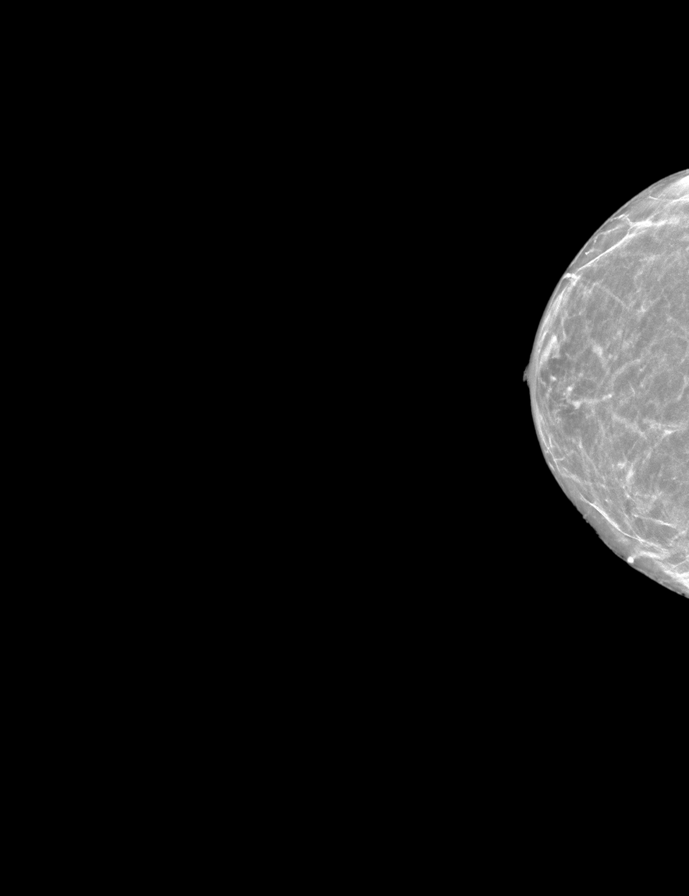

[R CC tomo · tomo slice 23/46.0]
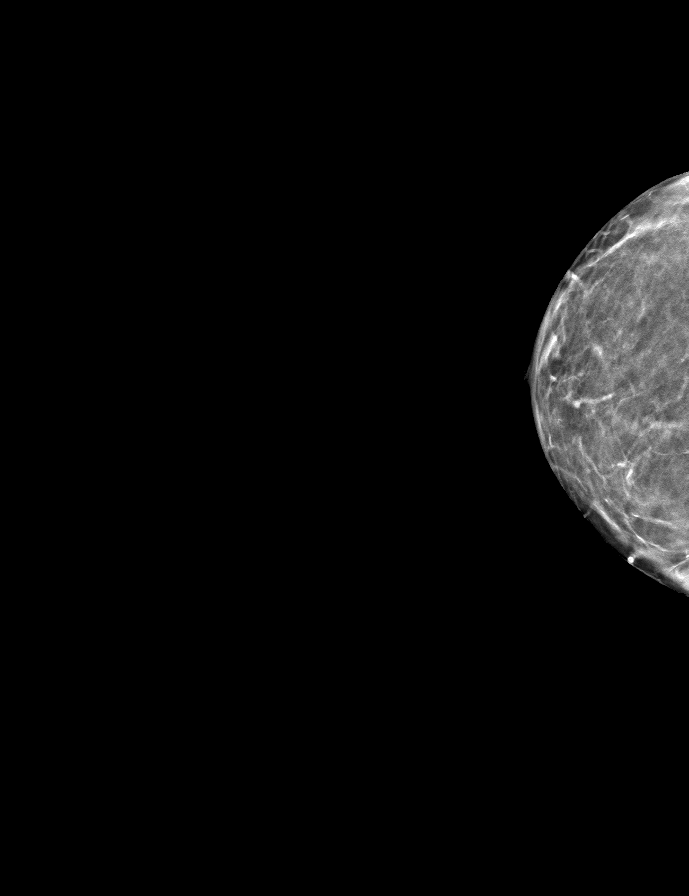

[9 of 28 positions shown; findings below may reference images not displayed]

ACR Breast Density Category b: There are scattered areas of
fibroglandular density.
FINDINGS: The patient has prepectoral implants. There are no findings
suspicious for malignancy.
IMPRESSION: No mammographic evidence of malignancy. A result letter of this
screening mammogram will be mailed directly to the patient.

RECOMMENDATION:
Screening mammogram in one year. (Code:UD-B-RPM)

BI-RADS CATEGORY  1:  Negative.

## 2022-08-05 DIAGNOSIS — R110 Nausea: Secondary | ICD-10-CM | POA: Diagnosis not present

## 2022-08-05 DIAGNOSIS — Z87442 Personal history of urinary calculi: Secondary | ICD-10-CM | POA: Diagnosis not present

## 2022-08-05 DIAGNOSIS — K5900 Constipation, unspecified: Secondary | ICD-10-CM | POA: Diagnosis not present

## 2022-08-05 DIAGNOSIS — N1330 Unspecified hydronephrosis: Secondary | ICD-10-CM | POA: Diagnosis not present

## 2022-08-05 DIAGNOSIS — N200 Calculus of kidney: Secondary | ICD-10-CM | POA: Diagnosis not present

## 2022-08-05 DIAGNOSIS — N201 Calculus of ureter: Secondary | ICD-10-CM | POA: Diagnosis not present

## 2022-08-05 DIAGNOSIS — N135 Crossing vessel and stricture of ureter without hydronephrosis: Secondary | ICD-10-CM | POA: Diagnosis not present

## 2022-08-05 DIAGNOSIS — Z79899 Other long term (current) drug therapy: Secondary | ICD-10-CM | POA: Diagnosis not present

## 2022-08-05 DIAGNOSIS — M47816 Spondylosis without myelopathy or radiculopathy, lumbar region: Secondary | ICD-10-CM | POA: Diagnosis not present

## 2022-08-05 DIAGNOSIS — R1012 Left upper quadrant pain: Secondary | ICD-10-CM | POA: Diagnosis not present

## 2022-08-06 NOTE — Progress Notes (Unsigned)
Subjective:  Patient ID: Gabriella Gonzalez, female    DOB: [birth year]  Age: 67 y.o. MRN: [id number]  Chief Complaint  Patient presents with   Hospitalization Follow-up    HPI Follow up ER visit  Patient was seen in ER for abdominal pain on 07/30/2022. She was treated for Hydronephrosis.  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  No stones were found. Treatment for this included tamsulosin and Oxycodone. She reports good compliance with treatment. She reports this condition is Unchanged. Patient followed up with [facility] urology yesterday.  They have her referred to see specialist in [place].  She is scheduled on August 12, 2022.  Patient reports continued discomfort although not severe pain on the left side.  No nausea other than when she took cefdinir.  She was given pain medicine and nausea medicine from urology.     07/08/2022    2:20 PM 05/06/2021    3:17 PM 01/16/2021    9:40 AM 12/04/2020    1:54 PM  Depression screen PHQ 2/9  Decreased Interest 0 0 0 0  Down, Depressed, Hopeless 0 0 0 0  PHQ - 2 Score 0 0 0 0  Altered sleeping 1     Tired, decreased energy 1     Change in appetite 0     Feeling bad or failure about yourself  0     Trouble concentrating 0     Moving slowly or fidgety/restless 0     Suicidal thoughts 0     PHQ-9 Score 2     Difficult doing work/chores Not difficult at all            12/04/2020    1:54 PM 12/04/2020    2:44 PM 01/16/2021    9:40 AM 05/06/2021    3:18 PM 07/08/2022    2:20 PM  [place] in the past year? 0  0 1 1  Was there an injury with Fall? 0  0 1 0  Fall Risk Category Calculator 0  0 2 1  Fall Risk Category (Retired) Low  Low Moderate Low  (RETIRED) Patient Fall Risk Level Low fall risk Low fall risk Low fall risk Low fall risk Low fall risk  Patient at Risk for Falls Due to No Fall Risks No Fall Risks  No Fall Risks Impaired balance/gait  Fall risk Follow up Falls evaluation completed Falls evaluation  completed;Falls prevention discussed  Falls evaluation completed Falls evaluation completed      Review of Systems  Constitutional:  Negative for chills, fatigue and fever.  HENT:  Negative for congestion, ear pain, rhinorrhea and sore throat.   Respiratory:  Negative for cough and shortness of breath.   Cardiovascular:  Negative for chest pain.  Gastrointestinal:  Negative for abdominal pain, constipation, diarrhea, nausea and vomiting.  Genitourinary:  Negative for dysuria and urgency.  Musculoskeletal:  Positive for back pain (kidney pain). Negative for myalgias.  Neurological:  Negative for dizziness, weakness, light-headedness and headaches.  Psychiatric/Behavioral:  Negative for dysphoric mood. The patient is not nervous/anxious.     Current Outpatient Medications on File Prior to Visit  Medication Sig Dispense Refill   oxyCODONE (OXY IR/ROXICODONE) 5 MG immediate release tablet Take 5 mg by mouth every 6 (six) hours.     promethazine (PHENERGAN) 12.5 MG tablet Take 12.5 mg by mouth every 4 (four) hours as needed.     tamsulosin (FLOMAX) 0.4 MG CAPS capsule Take 0.4 mg by mouth 2 (  two) times daily.     tizanidine (ZANAFLEX) 2 MG capsule Take 1 capsule (2 mg total) by mouth 3 (three) times daily. (Patient not taking: Reported on 08/07/2022) 30 capsule 0   No current facility-administered medications on file prior to visit.   Past Medical History:  Diagnosis Date   Benign hypertension    Bronchitis    Past Surgical History:  Procedure Laterality Date   BREAST ENHANCEMENT SURGERY      Family History  Problem Relation Age of Onset   COPD Mother    Cancer Father        bladder   Social History   Socioeconomic History   Marital status: Married    Spouse name: Lillee Mooneyhan   Number of children: 2   Years of education: Not on file   Highest education level: Not on file  Occupational History   Occupation: Retired  Tobacco Use   Smoking status: Every Day    Packs/day:  0.50    Types: Cigarettes   Smokeless tobacco: Never  Substance and Sexual Activity   Alcohol use: Yes    Alcohol/week: 14.0 standard drinks of alcohol    Types: 14 Standard drinks or equivalent per week    Comment: 1-2 per day   Drug use: Never   Sexual activity: Not on file  Other Topics Concern   Not on file  Social History Narrative   Not on file   Social Determinants of Health   Financial Resource Strain: Not on file  Food Insecurity: No Food Insecurity (05/06/2021)   Hunger Vital Sign    Worried About Running Out of Food in the Last Year: Never true    Ran Out of Food in the Last Year: Never true  Transportation Needs: No Transportation Needs (05/06/2021)   PRAPARE - Hydrologist (Medical): No    Lack of Transportation (Non-Medical): No  Physical Activity: Inactive (05/06/2021)   Exercise Vital Sign    Days of Exercise per Week: 0 days    Minutes of Exercise per Session: 0 min  Stress: Not on file  Social Connections: Not on file    Objective:  BP (!) 110/58 (BP Location: Left Arm, Patient Position: Sitting)   Pulse 95   Temp (!) 97.4 F (36.3 C) (Temporal)   Ht 5\' 2"  (1.575 m)   Wt 111 lb 12.8 oz (50.7 kg)   SpO2 97%   BMI 20.45 kg/m      08/07/2022    8:21 AM 07/23/2022    7:59 AM 05/06/2021    2:50 PM  BP/Weight  Systolic BP 557 92 322  Diastolic BP 58 52 76  Wt. (Lbs) 111.8 115.6 115.6  BMI 20.45 kg/m2 21.14 kg/m2 21.84 kg/m2    Physical Exam Vitals reviewed.  Constitutional:      Appearance: Normal appearance. She is normal weight.  Cardiovascular:     Rate and Rhythm: Normal rate and regular rhythm.     Heart sounds: Normal heart sounds.  Pulmonary:     Effort: Pulmonary effort is normal.     Breath sounds: Normal breath sounds.  Abdominal:     General: Abdomen is flat. Bowel sounds are normal.     Palpations: Abdomen is soft.     Tenderness: There is abdominal tenderness (LLQ). There is left CVA tenderness.   Neurological:     Mental Status: She is alert and oriented to person, place, and time.  Psychiatric:  Mood and Affect: Mood normal.        Behavior: Behavior normal.     Diabetic Foot Exam - Simple   No data filed      Lab Results  Component Value Date   WBC 10.8 07/23/2022   HGB 11.5 07/23/2022   HCT 35.2 07/23/2022   PLT 433 07/23/2022   GLUCOSE 93 07/23/2022   CHOL 167 01/16/2021   TRIG 127 01/16/2021   HDL 70 01/16/2021   LDLCALC 75 01/16/2021   ALT 15 07/23/2022   AST 16 07/23/2022   NA 139 07/23/2022   K 4.5 07/23/2022   CL 101 07/23/2022   CREATININE 1.07 (H) 07/23/2022   BUN 16 07/23/2022   CO2 21 07/23/2022   TSH 2.610 07/23/2022      Assessment & Plan:    Hydronephrosis with ureteropelvic junction (UPJ) obstruction Assessment & Plan: Keep appointment with urology in [place]. Check CMP. Use medications if needed. If worsens severely patient should go to the emergency department.  Orders: -     Comprehensive metabolic panel    No orders of the defined types were placed in this encounter.   Orders Placed This Encounter  Procedures   Comprehensive metabolic panel     Follow-up: Return if symptoms worsen or fail to improve.  An After Visit Summary was printed and given to the patient.    I,Lauren M Auman,acting as a scribe for Rochel Brome, MD.,have documented all relevant documentation on the behalf of Rochel Brome, MD,as directed by  Rochel Brome, MD while in the presence of Rochel Brome, MD.    Rochel Brome, MD [place] [phone]

## 2022-08-07 ENCOUNTER — Encounter: Payer: Self-pay | Admitting: Family Medicine

## 2022-08-07 ENCOUNTER — Ambulatory Visit (INDEPENDENT_AMBULATORY_CARE_PROVIDER_SITE_OTHER): Payer: Medicare HMO | Admitting: Family Medicine

## 2022-08-07 VITALS — BP 110/58 | HR 95 | Temp 97.4°F | Ht 62.0 in | Wt 111.8 lb

## 2022-08-07 DIAGNOSIS — Q6211 Congenital occlusion of ureteropelvic junction: Secondary | ICD-10-CM

## 2022-08-07 NOTE — Assessment & Plan Note (Signed)
Keep appointment with urology in [place]. Check CMP. Use medications if needed. If worsens severely patient should go to the emergency department.

## 2022-08-08 LAB — COMPREHENSIVE METABOLIC PANEL
ALT: 28 IU/L (ref 0–32)
AST: 39 IU/L (ref 0–40)
Albumin/Globulin Ratio: 1.2 (ref 1.2–2.2)
Albumin: 3.8 g/dL — ABNORMAL LOW (ref 3.9–4.9)
Alkaline Phosphatase: 132 IU/L — ABNORMAL HIGH (ref 44–121)
BUN/Creatinine Ratio: 11 — ABNORMAL LOW (ref 12–28)
BUN: 14 mg/dL (ref 8–27)
Bilirubin Total: 0.6 mg/dL (ref 0.0–1.2)
CO2: 20 mmol/L (ref 20–29)
Calcium: 9.7 mg/dL (ref 8.7–10.3)
Chloride: 98 mmol/L (ref 96–106)
Creatinine, Ser: 1.29 mg/dL — ABNORMAL HIGH (ref 0.57–1.00)
Globulin, Total: 3.3 g/dL (ref 1.5–4.5)
Glucose: 92 mg/dL (ref 70–99)
Potassium: 5.1 mmol/L (ref 3.5–5.2)
Sodium: 136 mmol/L (ref 134–144)
Total Protein: 7.1 g/dL (ref 6.0–8.5)
eGFR: 45 mL/min/{1.73_m2} — ABNORMAL LOW (ref >59)

## 2022-08-12 DIAGNOSIS — Q6211 Congenital occlusion of ureteropelvic junction: Secondary | ICD-10-CM | POA: Diagnosis not present

## 2022-08-12 DIAGNOSIS — Z79899 Other long term (current) drug therapy: Secondary | ICD-10-CM | POA: Diagnosis not present

## 2022-08-26 DIAGNOSIS — N135 Crossing vessel and stricture of ureter without hydronephrosis: Secondary | ICD-10-CM | POA: Diagnosis not present

## 2022-08-26 DIAGNOSIS — Q6211 Congenital occlusion of ureteropelvic junction: Secondary | ICD-10-CM | POA: Diagnosis not present

## 2022-08-28 ENCOUNTER — Other Ambulatory Visit: Payer: Self-pay

## 2022-08-28 DIAGNOSIS — M545 Low back pain, unspecified: Secondary | ICD-10-CM

## 2022-08-28 MED ORDER — TIZANIDINE HCL 2 MG PO CAPS: 2.0000 mg | ORAL_CAPSULE | Freq: Three times a day (TID) | ORAL | 0 refills | Status: DC

## 2022-09-01 ENCOUNTER — Other Ambulatory Visit: Payer: Self-pay

## 2022-09-01 DIAGNOSIS — M545 Low back pain, unspecified: Secondary | ICD-10-CM

## 2022-09-01 MED ORDER — TIZANIDINE HCL 2 MG PO TABS: 2.0000 mg | ORAL_TABLET | Freq: Three times a day (TID) | ORAL | 0 refills | Status: AC

## 2022-09-01 NOTE — Telephone Encounter (Signed)
Tizanidine needs to be changed to tablets not capsules due to what insurance pays for. Please re-send.

## 2022-09-08 DIAGNOSIS — N136 Pyonephrosis: Secondary | ICD-10-CM | POA: Diagnosis not present

## 2022-09-08 DIAGNOSIS — N131 Hydronephrosis with ureteral stricture, not elsewhere classified: Secondary | ICD-10-CM | POA: Diagnosis not present

## 2022-09-08 DIAGNOSIS — B957 Other staphylococcus as the cause of diseases classified elsewhere: Secondary | ICD-10-CM | POA: Diagnosis not present

## 2022-09-08 DIAGNOSIS — K660 Peritoneal adhesions (postprocedural) (postinfection): Secondary | ICD-10-CM | POA: Diagnosis not present

## 2022-09-09 DIAGNOSIS — N136 Pyonephrosis: Secondary | ICD-10-CM | POA: Diagnosis not present

## 2022-09-09 DIAGNOSIS — B957 Other staphylococcus as the cause of diseases classified elsewhere: Secondary | ICD-10-CM | POA: Diagnosis not present

## 2022-09-19 DIAGNOSIS — Q6211 Congenital occlusion of ureteropelvic junction: Secondary | ICD-10-CM | POA: Diagnosis not present

## 2022-10-07 DIAGNOSIS — Z936 Other artificial openings of urinary tract status: Secondary | ICD-10-CM | POA: Diagnosis not present

## 2022-10-07 DIAGNOSIS — N12 Tubulo-interstitial nephritis, not specified as acute or chronic: Secondary | ICD-10-CM | POA: Diagnosis not present

## 2022-10-07 DIAGNOSIS — Q6211 Congenital occlusion of ureteropelvic junction: Secondary | ICD-10-CM | POA: Diagnosis not present

## 2022-10-07 DIAGNOSIS — R944 Abnormal results of kidney function studies: Secondary | ICD-10-CM | POA: Diagnosis not present

## 2022-10-07 DIAGNOSIS — N135 Crossing vessel and stricture of ureter without hydronephrosis: Secondary | ICD-10-CM | POA: Diagnosis not present

## 2022-10-17 DIAGNOSIS — N289 Disorder of kidney and ureter, unspecified: Secondary | ICD-10-CM | POA: Diagnosis not present

## 2022-10-17 DIAGNOSIS — N111 Chronic obstructive pyelonephritis: Secondary | ICD-10-CM | POA: Diagnosis not present

## 2022-10-17 DIAGNOSIS — N135 Crossing vessel and stricture of ureter without hydronephrosis: Secondary | ICD-10-CM | POA: Diagnosis not present

## 2022-10-30 DIAGNOSIS — Q6211 Congenital occlusion of ureteropelvic junction: Secondary | ICD-10-CM | POA: Diagnosis not present

## 2022-12-26 LAB — FECAL OCCULT BLOOD, IMMUNOCHEMICAL: IFOBT: NEGATIVE

## 2023-01-06 ENCOUNTER — Encounter: Payer: Self-pay | Admitting: Family Medicine

## 2023-02-03 DIAGNOSIS — Z905 Acquired absence of kidney: Secondary | ICD-10-CM | POA: Diagnosis not present

## 2023-02-03 DIAGNOSIS — Q6211 Congenital occlusion of ureteropelvic junction: Secondary | ICD-10-CM | POA: Diagnosis not present

## 2023-03-17 DIAGNOSIS — H353131 Nonexudative age-related macular degeneration, bilateral, early dry stage: Secondary | ICD-10-CM | POA: Diagnosis not present

## 2023-03-17 DIAGNOSIS — H5203 Hypermetropia, bilateral: Secondary | ICD-10-CM | POA: Diagnosis not present

## 2023-03-17 DIAGNOSIS — H26493 Other secondary cataract, bilateral: Secondary | ICD-10-CM | POA: Diagnosis not present

## 2023-03-17 DIAGNOSIS — H524 Presbyopia: Secondary | ICD-10-CM | POA: Diagnosis not present

## 2023-03-17 DIAGNOSIS — H3530 Unspecified macular degeneration: Secondary | ICD-10-CM | POA: Diagnosis not present

## 2023-03-17 DIAGNOSIS — G43B1 Ophthalmoplegic migraine, intractable: Secondary | ICD-10-CM | POA: Diagnosis not present

## 2023-03-17 DIAGNOSIS — H52223 Regular astigmatism, bilateral: Secondary | ICD-10-CM | POA: Diagnosis not present

## 2023-03-17 DIAGNOSIS — Z961 Presence of intraocular lens: Secondary | ICD-10-CM | POA: Diagnosis not present

## 2023-03-19 DIAGNOSIS — Z0100 Encounter for examination of eyes and vision without abnormal findings: Secondary | ICD-10-CM | POA: Diagnosis not present

## 2023-08-13 ENCOUNTER — Ambulatory Visit: Payer: Medicare HMO

## 2023-08-13 DIAGNOSIS — Z0000 Encounter for general adult medical examination without abnormal findings: Secondary | ICD-10-CM | POA: Diagnosis not present

## 2023-08-13 NOTE — Patient Instructions (Addendum)
 Gabriella Gonzalez , Thank you for taking time to come for your Medicare Wellness Visit. I appreciate your ongoing commitment to your health goals. Please review the following plan we discussed and let me know if I can assist you in the future.   Referrals/Orders/Follow-Ups/Clinician Recommendations: Yes; Keep maintaining your health by keeping your appointments with Dr. Sherre and any specialists that you may see.  Call us  if you need anything.  Have a great year!!!!  This is a list of the screening recommended for you and due dates:  Health Maintenance  Topic Date Due   Hepatitis C Screening  Never done   DTaP/Tdap/Td vaccine (1 - Tdap) Never done   Zoster (Shingles) Vaccine (1 of 2) Never done   Screening for Lung Cancer  Never done   Mammogram  01/29/2023   Flu Shot  02/05/2023   Stool Blood Test  12/26/2023   Colon Cancer Screening  02/25/2024   Medicare Annual Wellness Visit  08/12/2024   Pneumonia Vaccine  Completed   DEXA scan (bone density measurement)  Completed   HPV Vaccine  Aged Out   COVID-19 Vaccine  Discontinued   Cologuard (Stool DNA test)  Discontinued    Advanced directives: (Declined) Advance directive discussed with you today. Even though you declined this today, please call our office should you change your mind, and we can give you the proper paperwork for you to fill out.  Next Medicare Annual Wellness Visit scheduled for next year: Yes; It was nice speaking with you today! Your next Annual Wellness Visit is scheduled for 08/16/2024 at 2:00 p.m. via PHONE. If you need to reschedule or cancel, please call [phone].

## 2023-08-13 NOTE — Progress Notes (Signed)
 Subjective:   Gabriella Gonzalez is a 68 y.o. female who presents for Medicare Annual (Subsequent) preventive examination.  Visit Complete: Virtual I connected with  Gabriella Gonzalez on 08/13/23 by a audio enabled telemedicine application and verified that I am speaking with the correct person using two identifiers.  Patient Location: Home  Provider Location: Office/Clinic  I discussed the limitations of evaluation and management by telemedicine. The patient expressed understanding and agreed to proceed.  Vital Signs: Because this visit was a virtual/telehealth visit, some criteria may be missing or patient reported. Any vitals not documented were not able to be obtained and vitals that have been documented are patient reported.  This patient declined Interactive audio and acupuncturist. Therefore the visit was completed with audio only.  Cardiac Risk Factors include: advanced age (>86men, >93 women);hypertension;smoking/ tobacco exposure     Objective:    Today's Vitals   08/13/23 1544  PainSc: 0-No pain   There is no height or weight on file to calculate BMI.     08/13/2023    3:46 PM 05/06/2021    3:18 PM  Advanced Directives  Does Patient Have a Medical Advance Directive? No No  Would patient like information on creating a medical advance directive? No - Patient declined Yes (MAU/Ambulatory/Procedural Areas - Information given)    Current Medications (verified) Outpatient Encounter Medications as of 08/13/2023  Medication Sig   oxyCODONE (OXY IR/ROXICODONE) 5 MG immediate release tablet Take 5 mg by mouth every 6 (six) hours. (Patient not taking: Reported on 08/13/2023)   promethazine (PHENERGAN) 12.5 MG tablet Take 12.5 mg by mouth every 4 (four) hours as needed. (Patient not taking: Reported on 08/13/2023)   tamsulosin (FLOMAX) 0.4 MG CAPS capsule Take 0.4 mg by mouth 2 (two) times daily. (Patient not taking: Reported on 08/13/2023)   tiZANidine  (ZANAFLEX ) 2 MG tablet Take  1 tablet (2 mg total) by mouth 3 (three) times daily. (Patient not taking: Reported on 08/13/2023)   No facility-administered encounter medications on file as of 08/13/2023.    Allergies (verified) Amlodipine, Cefdinir, and Lisinopril   History: Past Medical History:  Diagnosis Date   Benign hypertension    Bronchitis    Past Surgical History:  Procedure Laterality Date   BREAST ENHANCEMENT SURGERY     Family History  Problem Relation Age of Onset   COPD Mother    Cancer Father        bladder   Social History   Socioeconomic History   Marital status: Married    Spouse name: Laquana Villari   Number of children: 2   Years of education: Not on file   Highest education level: Not on file  Occupational History   Occupation: Retired  Tobacco Use   Smoking status: Every Day    Current packs/day: 0.50    Types: Cigarettes   Smokeless tobacco: Never  Substance and Sexual Activity   Alcohol use: Yes    Alcohol/week: 14.0 standard drinks of alcohol    Types: 14 Standard drinks or equivalent per week    Comment: 1-2 per day   Drug use: Never   Sexual activity: Not on file  Other Topics Concern   Not on file  Social History Narrative   Not on file   Social Drivers of Health   Financial Resource Strain: Low Risk  (08/13/2023)   Overall Financial Resource Strain (CARDIA)    Difficulty of Paying Living Expenses: Not hard at all  Food Insecurity: No Food  Insecurity (08/13/2023)   Hunger Vital Sign    Worried About Running Out of Food in the Last Year: Never true    Ran Out of Food in the Last Year: Never true  Transportation Needs: No Transportation Needs (08/13/2023)   PRAPARE - Administrator, Civil Service (Medical): No    Lack of Transportation (Non-Medical): No  Physical Activity: Sufficiently Active (08/13/2023)   Exercise Vital Sign    Days of Exercise per Week: 7 days    Minutes of Exercise per Session: 60 min  Stress: No Stress Concern Present (08/13/2023)    [employer] of Occupational Health - Occupational Stress Questionnaire    Feeling of Stress : Not at all  Social Connections: Moderately Integrated (08/13/2023)   Social Connection and Isolation Panel [NHANES]    Frequency of Communication with Friends and Family: More than three times a week    Frequency of Social Gatherings with Friends and Family: More than three times a week    Attends Religious Services: Never    Database Administrator or Organizations: Yes    Attends Banker Meetings: Never    Marital Status: Married    Tobacco Counseling Ready to quit: Not Answered Counseling given: Not Answered   Clinical Intake:  Pre-visit preparation completed: Yes  Pain : No/denies pain Pain Score: 0-No pain     Nutritional Risks: None Diabetes: No  How often do you need to have someone help you when you read instructions, pamphlets, or other written materials from your doctor or pharmacy?: 1 - Never What is the last grade level you completed in school?: HSG  Interpreter Needed?: No  Information entered by :: Tres Grzywacz N. Cherlyn Syring, LPN.   Activities of Daily Living    08/13/2023    3:50 PM  In your present state of health, do you have any difficulty performing the following activities:  Hearing? 0  Vision? 0  Difficulty concentrating or making decisions? 0  Comment BSE: reading, loves going to [employer] or climbing stairs? 0  Dressing or bathing? 0  Doing errands, shopping? 0  Preparing Food and eating ? N  Using the Toilet? N  In the past six months, have you accidently leaked urine? N  Do you have problems with loss of bowel control? N  Managing your Medications? N  Managing your Finances? N  Housekeeping or managing your Housekeeping? N    Patient Care Team: Sherre Clapper, MD as PCP - General (Family Medicine) Tommi Norway, MD as Referring Physician (Urology)  Indicate any recent Medical Services you may have received from other than [facility]  providers in the past year (date may be approximate).     Assessment:   This is a routine wellness examination for Gabriella Gonzalez.  Hearing/Vision screen Hearing Screening - Comments:: Denies hearing difficulties. No hearing aids.  Vision Screening - Comments:: Wears rx glasses - up to date with [facility] eye exams [facility]    Goals Addressed   None   Depression Screen    08/13/2023    3:51 PM 07/08/2022    2:20 PM 05/06/2021    3:17 PM 01/16/2021    9:40 AM 12/04/2020    1:54 PM  PHQ 2/9 Scores  PHQ - 2 Score 0 0 0 0 0  PHQ- 9 Score 0 2       Fall Risk    08/13/2023    3:48 PM 07/08/2022    2:20 PM 05/06/2021  3:18 PM 01/16/2021    9:40 AM 12/04/2020    2:44 PM  Fall Risk   Falls in the past year? 0 1 1 0   Number falls in past yr: 0 0 0 0   Injury with Fall? 0 0 1 0   Risk for fall due to : No Fall Risks Impaired balance/gait No Fall Risks  No Fall Risks  Follow up Falls prevention discussed;Falls evaluation completed Falls evaluation completed Falls evaluation completed  Falls evaluation completed;Falls prevention discussed    MEDICARE RISK AT HOME: Medicare Risk at Home Any stairs in or around the home?: Yes If so, are there any without handrails?: No Home free of loose throw rugs in walkways, pet beds, electrical cords, etc?: Yes Adequate lighting in your home to reduce risk of falls?: Yes Life alert?: No Use of a cane, walker or w/c?: No Grab bars in the bathroom?: No Shower chair or bench in shower?: No Elevated toilet seat or a handicapped toilet?: No  TIMED UP AND GO:  Was the test performed?  No    Cognitive Function:    08/13/2023    3:49 PM  MMSE - Mini Mental State Exam  Not completed: Unable to complete        08/13/2023    3:49 PM 05/06/2021    4:16 PM  6CIT Screen  What Year? 0 points 0 points  What month? 0 points 0 points  What time? 0 points 0 points  Count back from 20 0 points 0 points  Months in reverse 0 points 0 points  Repeat  phrase 0 points 0 points  Total Score 0 points 0 points    Immunizations Immunization History  Administered Date(s) Administered   Fluad Quad(high Dose 65+) 04/22/2021   Influenza,inj,Quad PF,6+ Mos 03/15/2019   Pneumococcal Conjugate-13 03/15/2019   Pneumococcal Polysaccharide-23 01/16/2021    TDAP status: Patient declined.  Flu Vaccine status: Declined, Education has been provided regarding the importance of this vaccine but patient still declined. Advised may receive this vaccine at local pharmacy or Health Dept. Aware to provide a copy of the vaccination record if obtained from local pharmacy or Health Dept. Verbalized acceptance and understanding.  Pneumococcal vaccine status: Up to date  Covid-19 vaccine status: Declined, Education has been provided regarding the importance of this vaccine but patient still declined. Advised may receive this vaccine at local pharmacy or Health Dept.or vaccine clinic. Aware to provide a copy of the vaccination record if obtained from local pharmacy or Health Dept. Verbalized acceptance and understanding.  Qualifies for Shingles Vaccine? Yes   Zostavax completed No   Shingrix Completed?: No.    Education has been provided regarding the importance of this vaccine. Patient has been advised to call insurance company to determine out of pocket expense if they have not yet received this vaccine. Advised may also receive vaccine at local pharmacy or Health Dept. Verbalized acceptance and understanding.  Screening Tests Health Maintenance  Topic Date Due   Hepatitis C Screening  Never done   DTaP/Tdap/Td (1 - Tdap) Never done   Zoster Vaccines- Shingrix (1 of 2) Never done   Lung Cancer Screening  Never done   MAMMOGRAM  01/29/2023   INFLUENZA VACCINE  02/05/2023   COLON CANCER SCREENING ANNUAL FOBT  12/26/2023   Colonoscopy  02/25/2024   Medicare Annual Wellness (AWV)  08/12/2024   Pneumonia Vaccine 67+ Years old  Completed   DEXA SCAN  Completed    HPV VACCINES  Aged Out   COVID-19 Vaccine  Discontinued   Fecal DNA (Cologuard)  Discontinued    Health Maintenance  Health Maintenance Due  Topic Date Due   Hepatitis C Screening  Never done   DTaP/Tdap/Td (1 - Tdap) Never done   Zoster Vaccines- Shingrix (1 of 2) Never done   Lung Cancer Screening  Never done   MAMMOGRAM  01/29/2023   INFLUENZA VACCINE  02/05/2023    Colorectal cancer screening: Type of screening: FOBT/FIT. Completed 12/26/2022. Repeat every 1 years  Mammogram status: Patient declined.  Bone Density status: Completed 05/07/2021. Results reflect: Bone density results: OSTEOPOROSIS. Repeat every 2 years.  Lung Cancer Screening: (Low Dose CT Chest recommended if Age 78-80 years, 20 pack-year currently smoking OR have quit w/in 15years.) does not qualify.   Lung Cancer Screening Referral: no  Additional Screening:  Hepatitis C Screening: does qualify; Completed: no  Vision Screening: Recommended annual ophthalmology exams for early detection of glaucoma and other disorders of the eye. Is the patient up to date with their annual eye exam?  Yes  Who is the provider or what is the name of the office in which the patient attends annual eye exams? [facility] If pt is not established with a provider, would they like to be referred to a provider to establish care? No .   Dental Screening: Recommended annual dental exams for proper oral hygiene.  Community Resource Referral / Chronic Care Management: CRR required this visit?  No   CCM required this visit?  No     Plan:     I have personally reviewed and noted the following in the patient's chart:   Medical and social history Use of alcohol, tobacco or illicit drugs  Current medications and supplements including opioid prescriptions. Patient is not currently taking opioid prescriptions. Functional ability and status Nutritional status Physical activity Advanced directives List of other  physicians Hospitalizations, surgeries, and ER visits in previous 12 months Vitals Screenings to include cognitive, depression, and falls Referrals and appointments  In addition, I have reviewed and discussed with patient certain preventive protocols, quality metrics, and best practice recommendations. A written personalized care plan for preventive services as well as general preventive health recommendations were provided to patient.     Roz LOISE Fuller, LPN   01/06/7973   After Visit Summary: (MyChart) Due to this being a telephonic visit, the after visit summary with patients personalized plan was offered to patient via MyChart   Nurse Notes: Patient declined all vaccines and mammogram.

## 2024-08-16 ENCOUNTER — Ambulatory Visit: Payer: Medicare HMO | Admitting: Family Medicine
